# Patient Record
Sex: Female | Born: 1965
Health system: Southern US, Community
[De-identification: ages and names within clinical notes are randomized; demographics above are authoritative.]

## PROBLEM LIST (undated history)

## (undated) DIAGNOSIS — T7840XA Allergy, unspecified, initial encounter: Secondary | ICD-10-CM

## (undated) DIAGNOSIS — I10 Essential (primary) hypertension: Secondary | ICD-10-CM

## (undated) DIAGNOSIS — R7303 Prediabetes: Secondary | ICD-10-CM

## (undated) HISTORY — DX: Prediabetes: R73.03

## (undated) HISTORY — DX: Allergy, unspecified, initial encounter: T78.40XA

## (undated) HISTORY — DX: Essential (primary) hypertension: I10

---

## 2003-06-18 HISTORY — PX: ABDOMINAL HYSTERECTOMY: SHX81

## 2013-07-12 ENCOUNTER — Encounter (HOSPITAL_COMMUNITY): Payer: Self-pay | Admitting: Emergency Medicine

## 2013-07-12 ENCOUNTER — Inpatient Hospital Stay (HOSPITAL_COMMUNITY)
Admission: EM | Admit: 2013-07-12 | Discharge: 2013-07-15 | DRG: 310 | Disposition: A | Discharge status: Home / Self Care | Payer: 59 | Attending: Internal Medicine | Admitting: Internal Medicine

## 2013-07-12 DIAGNOSIS — I509 Heart failure, unspecified: Secondary | ICD-10-CM | POA: Diagnosis present

## 2013-07-12 DIAGNOSIS — R42 Dizziness and giddiness: Secondary | ICD-10-CM

## 2013-07-12 DIAGNOSIS — I4891 Unspecified atrial fibrillation: Principal | ICD-10-CM | POA: Diagnosis present

## 2013-07-12 DIAGNOSIS — R9431 Abnormal electrocardiogram [ECG] [EKG]: Secondary | ICD-10-CM

## 2013-07-12 DIAGNOSIS — R079 Chest pain, unspecified: Secondary | ICD-10-CM

## 2013-07-12 DIAGNOSIS — I498 Other specified cardiac arrhythmias: Secondary | ICD-10-CM | POA: Diagnosis present

## 2013-07-12 DIAGNOSIS — Z8249 Family history of ischemic heart disease and other diseases of the circulatory system: Secondary | ICD-10-CM

## 2013-07-12 LAB — BASIC METABOLIC PANEL
BUN: 10 mg/dL (ref 6–23)
CO2: 21 mEq/L (ref 19–32)
Calcium: 8.4 mg/dL (ref 8.4–10.5)
Chloride: 105 mEq/L (ref 96–112)
Creatinine, Ser: 0.59 mg/dL (ref 0.50–1.10)
Glucose, Bld: 136 mg/dL — ABNORMAL HIGH (ref 70–99)
POTASSIUM: 3.1 meq/L — AB (ref 3.7–5.3)
SODIUM: 142 meq/L (ref 137–147)

## 2013-07-12 LAB — CBC
HCT: 38.6 % (ref 36.0–46.0)
Hemoglobin: 12.7 g/dL (ref 12.0–15.0)
MCH: 29.1 pg (ref 26.0–34.0)
MCHC: 32.9 g/dL (ref 30.0–36.0)
MCV: 88.3 fL (ref 78.0–100.0)
Platelets: 184 10*3/uL (ref 150–400)
RBC: 4.37 MIL/uL (ref 3.87–5.11)
RDW: 12.9 % (ref 11.5–15.5)
WBC: 9.3 10*3/uL (ref 4.0–10.5)

## 2013-07-12 MED ORDER — MECLIZINE HCL 25 MG PO TABS
50.0000 mg | ORAL_TABLET | Freq: Once | ORAL | Status: AC
  Administered 2013-07-12: 50 mg via ORAL
  Filled 2013-07-12: qty 2

## 2013-07-12 MED ORDER — SODIUM CHLORIDE 0.9 % IV BOLUS (SEPSIS)
1000.0000 mL | Freq: Once | INTRAVENOUS | Status: AC
  Administered 2013-07-12: 1000 mL via INTRAVENOUS

## 2013-07-12 MED ORDER — ONDANSETRON HCL 4 MG/2ML IJ SOLN
4.0000 mg | Freq: Once | INTRAMUSCULAR | Status: AC
  Administered 2013-07-12: 4 mg via INTRAVENOUS
  Filled 2013-07-12: qty 2

## 2013-07-12 MED ORDER — POTASSIUM CHLORIDE CRYS ER 20 MEQ PO TBCR
40.0000 meq | EXTENDED_RELEASE_TABLET | Freq: Once | ORAL | Status: AC
  Administered 2013-07-12: 40 meq via ORAL
  Filled 2013-07-12: qty 2

## 2013-07-12 MED ORDER — DIAZEPAM 5 MG/ML IJ SOLN
5.0000 mg | Freq: Once | INTRAMUSCULAR | Status: AC
  Administered 2013-07-12: 10 mg via INTRAVENOUS
  Filled 2013-07-12: qty 2

## 2013-07-12 NOTE — ED Notes (Signed)
Bed: IW80 Expected date: 07/12/13 Expected time: 8:50 PM Means of arrival: Ambulance Comments: n,v

## 2013-07-12 NOTE — ED Provider Notes (Signed)
TIME SEEN: 9:10 PM  CHIEF COMPLAINT: Vertigo  HPI: Patient is a 48 y.o. female with no significant past medical history presents emergency department with sudden onset vertigo and vomiting that started while she was in class. She states it is worse with opening her eyes and movement of her head. She denies any headache, numbness, weakness. No chest pain or shortness of breath. Denies any recent fever. No hearing loss, tinnitus or ear pain. She states she had similar symptoms last week that spontaneously resolved without intervention.  ROS: See HPI Constitutional: no fever  Eyes: no drainage  ENT: no runny nose   Cardiovascular:  no chest pain  Resp: no SOB  GI: no vomiting GU: no dysuria Integumentary: no rash  Allergy: no hives  Musculoskeletal: no leg swelling  Neurological: no slurred speech ROS otherwise negative  PAST MEDICAL HISTORY/PAST SURGICAL HISTORY:  History reviewed. No pertinent past medical history.  MEDICATIONS:  Prior to Admission medications   Not on File    ALLERGIES:  Allergies not on file  SOCIAL HISTORY:  History  Substance Use Topics  . Smoking status: Not on file  . Smokeless tobacco: Not on file  . Alcohol Use: Not on file    FAMILY HISTORY: No family history on file. no family history of CAD or CVA.  EXAM: BP 125/68  Pulse 76  Temp(Src) 97.8 F (36.6 C) (Oral)  Resp 18  SpO2 95% CONSTITUTIONAL: Alert and oriented and responds appropriately to questions. Well-appearing; well-nourished HEAD: Normocephalic EYES: Conjunctivae clear, PERRL ENT: normal nose; no rhinorrhea; moist mucous membranes; pharynx without lesions noted, TMs are clear bilaterally NECK: Supple, no meningismus, no LAD  CARD: RRR; S1 and S2 appreciated; no murmurs, no clicks, no rubs, no gallops RESP: Normal chest excursion without splinting or tachypnea; breath sounds clear and equal bilaterally; no wheezes, no rhonchi, no rales,  ABD/GI: Normal bowel sounds;  non-distended; soft, non-tender, no rebound, no guarding BACK:  The back appears normal and is non-tender to palpation, there is no CVA tenderness EXT: Normal ROM in all joints; non-tender to palpation; no edema; normal capillary refill; no cyanosis    SKIN: Normal color for age and race; warm NEURO: Moves all extremities equally, strength 5/5 in all 4 extremities, cranial nerves II through XII intact, sensation to light touch intact diffusely, patient has no dysmetria finger-nose testing, patient does have horizontal nystagmus that is fatigable with opening her eyes PSYCH: The patient's mood and manner are appropriate. Grooming and personal hygiene are appropriate.  MEDICAL DECISION MAKING: Patient here with likely peripheral vertigo. She has no risk factors for central vertigo. No history of head injury. No anticoagulation. Unable to perform Dix-Hallpike given patient is asymptomatic. Will check basic labs and urine. We'll give IV fluids, Zofran and Valium and reassess. I do not feel she needs head imaging at this time. Patient is status post hysterectomy  ED PROGRESS: Patient has some improvement in her symptoms after IV fluids, Zofran and Valium. She is able to ambulate with assistance. Will give a dose of meclizine and reassess. Labs are unremarkable.  12:22 AM  Pt heart rate jumped to the 150s. She is currently in atrial fibrillation. She denies a history of any arrhythmia. No history of cardiac disease. She denies having any chest pain, shortness of breath or palpitations currently. She is still normotensive. Will add on troponin. Will check chest x-ray. Will give second liter IV fluids, diltiazem bolus and drip. She has a family history of cardiac disease. No  prior history of PE or DVT. No lower extremity swelling or pain. No recent prolonged immobilization such as long flight or hospitalization, surgery, fracture, trauma. No exogenous hormone use or tobacco use. She does not have a primary care  physician or cardiologist.  12:51 AM  Pt's troponin is negative. Chest x-ray shows no infiltrate or edema.  1:04 AM  Pt's heart rate is now in the 90s-110s on diltiazem drip; she is still in atrial fibrillation. Will discuss with hospitalist for admission.  1:20 AM  Spoke with Dr. Laren Everts for admission.   EKG Interpretation    Date/Time:  Monday July 12 2013 21:04:02 EST Ventricular Rate:  91 PR Interval:  166 QRS Duration: 91 QT Interval:  376 QTC Calculation: 463 R Axis:   72 Text Interpretation:  Age not entered, assumed to be  48 years old for purpose of ECG interpretation Sinus rhythm Consider right atrial enlargement Borderline T abnormalities, diffuse leads Baseline wander in lead(s) V1 Confirmed by Quade Ramirez  DO, Brendia Dampier [phone]) on 07/12/2013 10:00:57 PM            EKG Interpretation    Date/Time:  Tuesday July 13 2013 00:10:31 EST Ventricular Rate:  156 PR Interval:  166 QRS Duration: 85 QT Interval:  302 QTC Calculation: 486 R Axis:   79 Text Interpretation:  Atrial fibrillation Ventricular premature complex Borderline repolarization abnormality Borderline ST elevation, lateral leads Borderline prolonged QT interval Baseline wander in lead(s) V1 Confirmed by Scotty Pinder  DO, Myla Mauriello ([id number]) on 07/13/2013 12:24:00 AM             CRITICAL CARE Performed by: Nyra Jabs   Total critical care time: 30 minutes  Critical care time was exclusive of separately billable procedures and treating other patients.  New onset a fib with RVR, rate > 150s, diltiazem drip started.  Critical care was necessary to treat or prevent imminent or life-threatening deterioration.  Critical care was time spent personally by me on the following activities: development of treatment plan with patient and/or surrogate as well as nursing, discussions with consultants, evaluation of patient's response to treatment, examination of patient, obtaining history from patient or surrogate,  ordering and performing treatments and interventions, ordering and review of laboratory studies, ordering and review of radiographic studies, pulse oximetry and re-evaluation of patient's condition.   [place], DO 07/13/13 0120

## 2013-07-12 NOTE — ED Notes (Signed)
Brought in from home from school with c/o sudden onset of dizziness with emesis.  Per EMS, pt reported that she was in class when she suddenly felt "hot" and dizzy and started vomiting.  Pt was diaphoretic on EMS' arrival at the scene--- pt's CBG was 131 by EMS.  Pt was given Zofran 4 mg IV and  NS 200 ml bolus en route to ED.

## 2013-07-13 ENCOUNTER — Emergency Department (HOSPITAL_COMMUNITY): Payer: 59

## 2013-07-13 ENCOUNTER — Encounter (HOSPITAL_COMMUNITY): Payer: Self-pay | Admitting: Emergency Medicine

## 2013-07-13 DIAGNOSIS — R9431 Abnormal electrocardiogram [ECG] [EKG]: Secondary | ICD-10-CM

## 2013-07-13 DIAGNOSIS — R42 Dizziness and giddiness: Secondary | ICD-10-CM

## 2013-07-13 DIAGNOSIS — I517 Cardiomegaly: Secondary | ICD-10-CM

## 2013-07-13 DIAGNOSIS — R079 Chest pain, unspecified: Secondary | ICD-10-CM

## 2013-07-13 DIAGNOSIS — I4891 Unspecified atrial fibrillation: Principal | ICD-10-CM

## 2013-07-13 LAB — BASIC METABOLIC PANEL
BUN: 6 mg/dL (ref 6–23)
CO2: 22 mEq/L (ref 19–32)
Calcium: 9.1 mg/dL (ref 8.4–10.5)
Chloride: 101 mEq/L (ref 96–112)
Creatinine, Ser: 0.49 mg/dL — ABNORMAL LOW (ref 0.50–1.10)
GFR calc Af Amer: >90 mL/min (ref >90)
Glucose, Bld: 161 mg/dL — ABNORMAL HIGH (ref 70–99)
Potassium: 4.1 mEq/L (ref 3.7–5.3)
SODIUM: 137 meq/L (ref 137–147)

## 2013-07-13 LAB — URINALYSIS, ROUTINE W REFLEX MICROSCOPIC
Bilirubin Urine: NEGATIVE
Glucose, UA: NEGATIVE mg/dL
Hgb urine dipstick: NEGATIVE
Ketones, ur: NEGATIVE mg/dL
Nitrite: NEGATIVE
PROTEIN: NEGATIVE mg/dL
SPECIFIC GRAVITY, URINE: 1.018 (ref 1.005–1.030)
UROBILINOGEN UA: 0.2 mg/dL (ref 0.0–1.0)
pH: 5.5 (ref 5.0–8.0)

## 2013-07-13 LAB — TROPONIN I
Troponin I: <0.3 ng/mL (ref <0.30)
Troponin I: <0.3 ng/mL (ref <0.30)

## 2013-07-13 LAB — URINE MICROSCOPIC-ADD ON

## 2013-07-13 LAB — TSH
TSH: 1.205 u[IU]/mL (ref 0.350–4.500)
TSH: 0.929 u[IU]/mL (ref 0.350–4.500)

## 2013-07-13 LAB — PROTIME-INR
INR: 0.99 (ref 0.00–1.49)
Prothrombin Time: 12.9 seconds (ref 11.6–15.2)

## 2013-07-13 LAB — POCT I-STAT TROPONIN I: Troponin i, poc: 0 ng/mL (ref 0.00–0.08)

## 2013-07-13 MED ORDER — SODIUM CHLORIDE 0.9 % IV BOLUS (SEPSIS)
1000.0000 mL | Freq: Once | INTRAVENOUS | Status: AC
  Administered 2013-07-13: 1000 mL via INTRAVENOUS

## 2013-07-13 MED ORDER — ONDANSETRON HCL 4 MG/2ML IJ SOLN
4.0000 mg | Freq: Once | INTRAMUSCULAR | Status: AC
  Administered 2013-07-13: 4 mg via INTRAVENOUS

## 2013-07-13 MED ORDER — SODIUM CHLORIDE 0.9 % IV SOLN
INTRAVENOUS | Status: AC
Start: 2013-07-13 — End: 2013-07-13
  Administered 2013-07-13: 01:00:00 via INTRAVENOUS

## 2013-07-13 MED ORDER — ZOLPIDEM TARTRATE 5 MG PO TABS: 5.0000 mg | ORAL_TABLET | Freq: Every evening | ORAL | Status: DC | PRN

## 2013-07-13 MED ORDER — ONDANSETRON HCL 4 MG PO TABS: 4.0000 mg | ORAL_TABLET | Freq: Four times a day (QID) | ORAL | Status: DC | PRN

## 2013-07-13 MED ORDER — ONDANSETRON HCL 4 MG/2ML IJ SOLN
4.0000 mg | Freq: Three times a day (TID) | INTRAMUSCULAR | Status: AC | PRN
Start: 2013-07-13 — End: 2013-07-13
  Filled 2013-07-13 (×2): qty 2

## 2013-07-13 MED ORDER — DILTIAZEM HCL 30 MG PO TABS
30.0000 mg | ORAL_TABLET | Freq: Four times a day (QID) | ORAL | Status: DC
  Administered 2013-07-13 – 2013-07-14 (×4): 30 mg via ORAL
  Filled 2013-07-13 (×12): qty 1

## 2013-07-13 MED ORDER — ACETAMINOPHEN 325 MG PO TABS
650.0000 mg | ORAL_TABLET | Freq: Four times a day (QID) | ORAL | Status: DC | PRN
  Administered 2013-07-13 – 2013-07-14 (×2): 650 mg via ORAL
  Filled 2013-07-13 (×2): qty 2

## 2013-07-13 MED ORDER — WARFARIN - PHARMACIST DOSING INPATIENT: Freq: Every day | Status: DC

## 2013-07-13 MED ORDER — ONDANSETRON HCL 4 MG/2ML IJ SOLN
4.0000 mg | Freq: Four times a day (QID) | INTRAMUSCULAR | Status: DC | PRN
  Administered 2013-07-13: 4 mg via INTRAVENOUS

## 2013-07-13 MED ORDER — DILTIAZEM LOAD VIA INFUSION
10.0000 mg | Freq: Once | INTRAVENOUS | Status: AC
  Administered 2013-07-13: 10 mg via INTRAVENOUS
  Filled 2013-07-13: qty 10

## 2013-07-13 MED ORDER — POTASSIUM CHLORIDE IN NACL 20-0.9 MEQ/L-% IV SOLN
INTRAVENOUS | Status: DC
  Administered 2013-07-13: 03:00:00 via INTRAVENOUS
  Filled 2013-07-13 (×3): qty 1000

## 2013-07-13 MED ORDER — ONDANSETRON HCL 4 MG PO TABS
4.0000 mg | ORAL_TABLET | Freq: Four times a day (QID) | ORAL | Status: DC
  Administered 2013-07-13 – 2013-07-14 (×4): 4 mg via ORAL
  Filled 2013-07-13 (×12): qty 1

## 2013-07-13 MED ORDER — COUMADIN BOOK
1.0000 | Freq: Once | Status: AC
  Administered 2013-07-13: 1
  Filled 2013-07-13 (×2): qty 1

## 2013-07-13 MED ORDER — HYDROCODONE-ACETAMINOPHEN 5-325 MG PO TABS: 1.0000 | ORAL_TABLET | ORAL | Status: DC | PRN

## 2013-07-13 MED ORDER — SODIUM CHLORIDE 0.9 % IJ SOLN
3.0000 mL | Freq: Two times a day (BID) | INTRAMUSCULAR | Status: DC
Start: 2013-07-13 — End: 2013-07-15
  Administered 2013-07-14 – 2013-07-15 (×4): 3 mL via INTRAVENOUS

## 2013-07-13 MED ORDER — METOPROLOL TARTRATE 25 MG PO TABS
25.0000 mg | ORAL_TABLET | Freq: Two times a day (BID) | ORAL | Status: DC
  Administered 2013-07-13 – 2013-07-15 (×6): 25 mg via ORAL
  Filled 2013-07-13 (×7): qty 1

## 2013-07-13 MED ORDER — MECLIZINE HCL 12.5 MG PO TABS
12.5000 mg | ORAL_TABLET | Freq: Three times a day (TID) | ORAL | Status: DC | PRN
  Filled 2013-07-13: qty 1

## 2013-07-13 MED ORDER — FUROSEMIDE 10 MG/ML IJ SOLN
40.0000 mg | Freq: Once | INTRAMUSCULAR | Status: AC
  Administered 2013-07-13: 40 mg via INTRAVENOUS
  Filled 2013-07-13: qty 4

## 2013-07-13 MED ORDER — ONDANSETRON HCL 4 MG/2ML IJ SOLN
4.0000 mg | Freq: Four times a day (QID) | INTRAMUSCULAR | Status: DC
Start: 2013-07-13 — End: 2013-07-15

## 2013-07-13 MED ORDER — ENOXAPARIN SODIUM 150 MG/ML ~~LOC~~ SOLN: 1.0000 mg/kg | Freq: Two times a day (BID) | SUBCUTANEOUS | Status: DC

## 2013-07-13 MED ORDER — WARFARIN VIDEO: Freq: Once | Status: DC

## 2013-07-13 MED ORDER — METOPROLOL TARTRATE 1 MG/ML IV SOLN: 2.5000 mg | Freq: Four times a day (QID) | INTRAVENOUS | Status: DC | PRN

## 2013-07-13 MED ORDER — ENOXAPARIN SODIUM 100 MG/ML ~~LOC~~ SOLN
100.0000 mg | Freq: Two times a day (BID) | SUBCUTANEOUS | Status: DC
  Administered 2013-07-13 (×3): 100 mg via SUBCUTANEOUS
  Filled 2013-07-13 (×5): qty 1

## 2013-07-13 MED ORDER — POTASSIUM CHLORIDE CRYS ER 20 MEQ PO TBCR
20.0000 meq | EXTENDED_RELEASE_TABLET | Freq: Two times a day (BID) | ORAL | Status: DC
  Administered 2013-07-13 – 2013-07-15 (×5): 20 meq via ORAL
  Filled 2013-07-13 (×6): qty 1

## 2013-07-13 MED ORDER — DILTIAZEM HCL 100 MG IV SOLR: 5.0000 mg/h | INTRAVENOUS | Status: DC

## 2013-07-13 MED ORDER — WARFARIN SODIUM 7.5 MG PO TABS
7.5000 mg | ORAL_TABLET | ORAL | Status: AC
  Administered 2013-07-13: 7.5 mg via ORAL
  Filled 2013-07-13: qty 1

## 2013-07-13 MED ORDER — ACETAMINOPHEN 650 MG RE SUPP: 650.0000 mg | Freq: Four times a day (QID) | RECTAL | Status: DC | PRN

## 2013-07-13 MED ORDER — MORPHINE SULFATE 2 MG/ML IJ SOLN
2.0000 mg | INTRAMUSCULAR | Status: DC | PRN
  Administered 2013-07-13: 2 mg via INTRAVENOUS
  Filled 2013-07-13: qty 1

## 2013-07-13 NOTE — Progress Notes (Addendum)
ANTICOAGULATION CONSULT NOTE - Initial Consult  Pharmacy Consult for warfarin Indication: atrial fibrillation  No Known Allergies  Patient Measurements: Height: 5\' 11"  (180.3 cm) Weight: 220 lb (99.791 kg) IBW/kg (Calculated) : 70.8 Heparin Dosing Weight:   Vital Signs: Temp: 97.9 F (36.6 C) (01/27 0111) Temp src: Oral (01/26 2106) BP: 119/70 mmHg (01/27 0430) Pulse Rate: 91 (01/27 0430)  Labs:  Recent Labs  07/12/13 2025 07/13/13 0250  HGB 12.7  --   HCT 38.6  --   PLT 184  --   CREATININE 0.59  --   TROPONINI  --  <0.30    Estimated Creatinine Clearance: 113.1 ml/min (by C-G formula based on Cr of 0.59).   Medical History: History reviewed. No pertinent past medical history.  Medications:  Scheduled:  . coumadin book  1 each Does not apply Once  . enoxaparin (LOVENOX) injection  100 mg Subcutaneous BID  . metoprolol tartrate  25 mg Oral BID  . sodium chloride  3 mL Intravenous Q12H  . [START ON 07/14/2013] warfarin   Does not apply Once  . Warfarin - Pharmacist Dosing Inpatient   Does not apply q1800    Assessment: Patient with new afib.  No prior anticoagulation noted.  Also on lovenox bridging.  Goal of Therapy:  INR 2-3    Plan:  Daily INR Warfarin 7.5mg  PO x1 at 0300 Coumadin book/video     Tyler Deis, Shea Stakes Crowford 07/13/2013,5:28 AM

## 2013-07-13 NOTE — Progress Notes (Signed)
[facility] PROGRESS NOTE  Teresa Little STM:[id number] DOB: 06-02-1966 DOA: 07/12/2013 PCP: Default, Provider, MD  Assessment/Plan: atrial fibrillation with a ventricular rate  New onset atrial fib  No risk factors  Place on Cardizem drip  By mouth Lopressor  When necessary IV Lopressor  Check echo  Check TSH  Consulted Cardiology for treatment recommendations since patient back in NSR    Vertigo  probably related to #1  IV fluids  When necessary Antivert   Congestive heart failure (by chest x-ray ) Give Lasix x1 dose    Code Status: Full  Family Communication: None Disposition Plan: Per cardio   Consultants: Dr. Golden Hurter (cardiology)    Procedures: [facility] 07/13/2013 Prominent vascular markings concerning for pulmonary edema.    Antibiotics:    HPI/Subjective: Teresa Little is a 48 y.o.BF PMHx none Presented today with dizziness and vomiting that started just after she finished her PM class. Patient denies any chest pains or palpitations. She denies fever or chills . Patient has been having similar episodes for the last few months and the last one was last week. No history of Coronary artery disease or atrial fibrillation. In the emergency room her rhythm suddenly went into atrial fib with rapid ventricular rate and she was started on Cardizem drip and I was called to admit. 1/27 although back in NSR still complaining of positive N./V., negative CP, negative SOB   Objective: Filed Vitals:   07/13/13 0600 07/13/13 0800 07/13/13 0900 07/13/13 0915  BP: 111/78 111/73 110/79 116/72  Pulse: 140 94 87 93  Temp:    98.1 F (36.7 C)  TempSrc:    Oral  Resp: 18 14 15 19   Height:      Weight:      SpO2: 96% 97% 97% 97%    Intake/Output Summary (Last 24 hours) at 07/13/13 0926 Last data filed at 07/13/13 0113  Gross per 24 hour  Intake   2000 ml  Output      0 ml  Net   2000 ml   Filed Weights   07/13/13 0222  Weight: 99.791 kg (220 lb)     Exam:   General: A/O X4, (+) N recent vomited  Cardiovascular: RRR, (-) M/R/G  Respiratory: CTA Bilat  Abdomen: Soft, NT, ND, (+) BS  Musculoskeletal: (-) Pedal Edema   Data Reviewed: Basic Metabolic Panel:  Recent Labs Lab 07/12/13 2025 07/13/13 0525  NA 142 137  K 3.1* 4.1  CL 105 101  CO2 21 22  GLUCOSE 136* 161*  BUN 10 6  CREATININE 0.59 0.49*  CALCIUM 8.4 9.1   Liver Function Tests: No results found for this basename: AST, ALT, ALKPHOS, BILITOT, PROT, ALBUMIN,  in the last 168 hours No results found for this basename: LIPASE, AMYLASE,  in the last 168 hours No results found for this basename: AMMONIA,  in the last 168 hours CBC:  Recent Labs Lab 07/12/13 2025  WBC 9.3  HGB 12.7  HCT 38.6  MCV 88.3  PLT 184   Cardiac Enzymes:  Recent Labs Lab 07/13/13 0250  TROPONINI <0.30   BNP (last 3 results) No results found for this basename: PROBNP,  in the last 8760 hours CBG: No results found for this basename: GLUCAP,  in the last 168 hours  No results found for this or any previous visit (from the past 240 hour(s)).   Studies: Dg Chest Port 1 View  07/13/2013   CLINICAL DATA:  Atrial fibrillation.  EXAM: PORTABLE CHEST -  1 VIEW  COMPARISON:  None available for comparison at time of study interpretation.  FINDINGS: Cardiomediastinal silhouette is unremarkable for this low inspiratory portable examination with crowded engorged vasculature markings. The lungs are clear without pleural effusions or focal consolidations. Trachea projects midline and there is no pneumothorax. Included soft tissue planes and osseous structures are non-suspicious. Multiple EKG lines overlie the patient and may obscure subtle underlying pathology.  IMPRESSION: Prominent vascular markings concerning for pulmonary edema.   Electronically Signed   By: Elon Alas   On: 07/13/2013 01:23    Scheduled Meds: . coumadin book  1 each Does not apply Once  . enoxaparin (LOVENOX)  injection  100 mg Subcutaneous BID  . metoprolol tartrate  25 mg Oral BID  . potassium chloride  20 mEq Oral BID  . sodium chloride  3 mL Intravenous Q12H  . [START ON 07/14/2013] warfarin   Does not apply Once  . Warfarin - Pharmacist Dosing Inpatient   Does not apply q1800   Continuous Infusions: . diltiazem (CARDIZEM) infusion      Principal Problem:   Atrial fibrillation with RVR Active Problems:   Vertigo   Atrial fibrillation, rapid    Time spent: 40 minutes   WOODS, CURTIS, J  [facility] Pager [phone]. If 7PM-7AM, please contact night-coverage at [email or web address], password [facility] 07/13/2013, 9:26 AM  LOS: 1 day

## 2013-07-13 NOTE — H&P (Addendum)
[facility]                                                                                    Patient Demographics  Teresa Little, is a 48 y.o. female  CSN: [id number]  MRN: [id number]  DOB - April 17, 1966  Admit Date - 07/12/2013  Outpatient Primary MD for the patient is Default, Provider, MD   With History of -  History reviewed. No pertinent past medical history.    History reviewed. No pertinent past surgical history.  in for   Chief Complaint  Patient presents with  . Dizziness  . Emesis     HPI  Teresa Little  is a 48 y.o. female, with no significant past medical history who presented today with dizziness and vomiting that started just after she finished her PM class. Patient denies any chest pains or palpitations. She denies fever or chills . Patient has been having similar episodes for the last few months and the last one was last week. No history of Coronary artery disease or atrial fibrillation. In the emergency room her rhythm suddenly went into atrial fib with rapid ventricular rate and she was started on Cardizem drip and I was called to admit.    Review of Systems    In addition to the HPI above,  No Fever-chills, No Headache, No changes with Vision or hearing, No problems swallowing food or Liquids, No Chest pain, Cough or Shortness of Breath, No Abdominal pain,  Bowel movements are regular, No Blood in stool or Urine, No dysuria, No new skin rashes or bruises, No new joints pains-aches,  No new weakness, tingling, numbness in any extremity, No recent weight gain or loss, No polyuria, polydypsia or polyphagia, No significant Mental Stressors.  A full 10 point Review of Systems was done, except as stated above, all other Review of Systems were negative.   Social History History  Substance Use Topics  . Smoking status: Never Smoker   . Smokeless tobacco: Never Used  . Alcohol Use: Yes     Family History Mother had hypertension father  had coronary artery disease status post CABG  Prior to Admission medications   Not on File    No Known Allergies  Physical Exam  Vitals  Blood pressure 113/61, pulse 89, temperature 97.9 F (36.6 C), temperature source Oral, resp. rate 18, height 5\' 11"  (1.803 m), weight 99.791 kg (220 lb), SpO2 95.00%.   1. General: Young [place] American female very pleasant looks tired  2. Normal affect and insight, Not Suicidal or Homicidal, Awake Alert, Oriented X 3.  3. No F.N deficits, ALL C.Nerves Intact, Strength 5/5 all 4 extremities, Sensation intact all 4 extremities, Plantars down going.  4. Ears and Eyes appear Normal, Conjunctivae clear, PERRLA. Moist Oral Mucosa.  5. Supple Neck, No JVD, No cervical lymphadenopathy appriciated, No Carotid Bruits.  6. Symmetrical Chest wall movement, Good air movement bilaterally, CTAB.  7. irregular, No Gallops, Rubs or Murmurs, No Parasternal Heave.  8. Positive Bowel Sounds, Abdomen Soft, Non tender, No organomegaly appriciated,No rebound -guarding or rigidity.  9.  No Cyanosis, Normal Skin Turgor, No Skin Rash or Bruise.  10.  Good muscle tone,  joints appear normal , no effusions, Normal ROM.  11. No Palpable Lymph Nodes in Neck or Axillae    Data Review  CBC  Recent Labs Lab 07/12/13 2025  WBC 9.3  HGB 12.7  HCT 38.6  PLT 184  MCV 88.3  MCH 29.1  MCHC 32.9  RDW 12.9   ------------------------------------------------------------------------------------------------------------------  Chemistries   Recent Labs Lab 07/12/13 2025  NA 142  K 3.1*  CL 105  CO2 21  GLUCOSE 136*  BUN 10  CREATININE 0.59  CALCIUM 8.4   ------------------------------------------------------------------------------------------------------------------ estimated creatinine clearance is 113.1 ml/min (by C-G formula based on Cr of 0.59). Urinalysis    Component Value Date/Time   COLORURINE YELLOW 07/12/2013 2321   APPEARANCEUR CLEAR  07/12/2013 2321   LABSPEC 1.018 07/12/2013 2321   PHURINE 5.5 07/12/2013 2321   GLUCOSEU NEGATIVE 07/12/2013 2321   HGBUR NEGATIVE 07/12/2013 2321   BILIRUBINUR NEGATIVE 07/12/2013 2321   KETONESUR NEGATIVE 07/12/2013 2321   PROTEINUR NEGATIVE 07/12/2013 2321   UROBILINOGEN 0.2 07/12/2013 2321   NITRITE NEGATIVE 07/12/2013 2321   LEUKOCYTESUR SMALL* 07/12/2013 2321    ----------------------------------------------------------------------------------------------------------------   Imaging results:   Dg Chest Port 1 View  07/13/2013   CLINICAL DATA:  Atrial fibrillation.  EXAM: PORTABLE CHEST - 1 VIEW  COMPARISON:  None available for comparison at time of study interpretation.  FINDINGS: Cardiomediastinal silhouette is unremarkable for this low inspiratory portable examination with crowded engorged vasculature markings. The lungs are clear without pleural effusions or focal consolidations. Trachea projects midline and there is no pneumothorax. Included soft tissue planes and osseous structures are non-suspicious. Multiple EKG lines overlie the patient and may obscure subtle underlying pathology.  IMPRESSION: Prominent vascular markings concerning for pulmonary edema.   Electronically Signed   By: Elon Alas   On: 07/13/2013 01:23    My personal review of EKG:    Assessment & Plan  1. atrial fibrillation with a ventricular rate    New onset atrial fib    No risk factors    Place on Cardizem drip    By mouth Lopressor    When necessary IV Lopressor    Check echo    Check TSH    Consider cardiology consult in a.m. for possible cardioversion  2. Vertigo probably related to #1    IV fluids    When necessary Antivert  3. Congestive heart failure by chest x-ray     Give Lasix x1 dose      DVT Prophylaxis Lovenox  AM Labs Ordered, also please review Full Orders  Family Communication: Admission, patients condition and plan of care including tests being ordered have been discussed  with the patient who indicates understanding and agrees with the plan and Code Status.  Code Status full  Disposition Plan: Home  Time spent in minutes : 34 minutes   Condition GUARDED

## 2013-07-13 NOTE — Consult Note (Signed)
Admit date: 07/12/2013 Referring Physician  Dr. Sherral Hammers Primary Cardiologist  NONE Reason for Consultation  Afib with RVR  HPI: Teresa Little is a 48 y.o. female, with no significant past medical history who presented today with dizziness and vomiting that started just after she finished her PM class. Patient denies any chest pains or palpitations. She denies fever or chills . Patient has been having similar episodes for the last few months and the last one was last week. No history of Coronary artery disease or atrial fibrillation. In the emergency room her rhythm suddenly went into atrial fib with rapid ventricular rate and she was started on Cardizem drip.  Cardiology is now asked to consult. She converted to NSR after IV Cardizem.  She denies any exertional chest pain or SOB.  She is not a smoker and only occasionally drinks alcohol.  She has no history of palpitations or afib.  She has a strong family history of CAD at an early age.  Her sister died of an MI at age 79 and was not diabetic or a smoker.  Her dad has CAD and has had a CABG.  She had an episode of chest discomfort last PM that was a pressure sensation but that is the first chest discomfort she has had.      PMH:  History reviewed. No pertinent past medical history.   PSH:   Past Surgical History  Procedure Laterality Date  . Abdominal hysterectomy  2005    Allergies:  Review of patient's allergies indicates no known allergies. Prior to Admit Meds:   (Not in a hospital admission) Fam HX:    Family History  Problem Relation Age of Onset  . Hypertension Mother   . Heart disease Father   . Coronary artery disease Father   . Heart attack Sister   . Heart disease Sister    Social HX:    History   Social History  . Marital Status: Legally Separated    Spouse Name: N/A    Number of Children: N/A  . Years of Education: N/A   Occupational History  . Not on file.   Social History Main Topics  . Smoking status: Never Smoker    . Smokeless tobacco: Never Used  . Alcohol Use: Yes     Comment: social  . Drug Use: No  . Sexual Activity: No   Other Topics Concern  . Not on file   Social History Narrative  . No narrative on file     ROS:  All 11 ROS were addressed and are negative except what is stated in the HPI  Physical Exam: Blood pressure 125/76, pulse 102, temperature 98.1 F (36.7 C), temperature source Oral, resp. rate 19, height 5\' 11"  (1.803 m), weight 220 lb (99.791 kg), SpO2 96.00%.    General: Well developed, well nourished, in no acute distress Head: Eyes PERRLA, No xanthomas.   Normal cephalic and atramatic  Lungs:   Clear bilaterally to auscultation and percussion. Heart:   HRRR S1 S2 Pulses are 2+ & equal.            No carotid bruit. No JVD.  No abdominal bruits. No femoral bruits. Abdomen: Bowel sounds are positive, abdomen soft and non-tender without masses  Extremities:   No clubbing, cyanosis or edema.  DP +1 Neuro: Alert and oriented X 3. Psych:  Good affect, responds appropriately    Labs:   Lab Results  Component Value Date   WBC 9.3 07/12/2013  HGB 12.7 07/12/2013   HCT 38.6 07/12/2013   MCV 88.3 07/12/2013   PLT 184 07/12/2013     Recent Labs Lab 07/13/13 0525  NA 137  K 4.1  CL 101  CO2 22  BUN 6  CREATININE 0.49*  CALCIUM 9.1  GLUCOSE 161*   No results found for this basename: PTT   Lab Results  Component Value Date   INR 0.99 07/13/2013   Lab Results  Component Value Date   TROPONINI <0.30 07/13/2013     No results found for this basename: CHOL   No results found for this basename: HDL   No results found for this basename: LDLCALC   No results found for this basename: TRIG   No results found for this basename: CHOLHDL   No results found for this basename: LDLDIRECT      Radiology:  Dg Chest Port 1 View  07/13/2013   CLINICAL DATA:  Atrial fibrillation.  EXAM: PORTABLE CHEST - 1 VIEW  COMPARISON:  None available for comparison at time of  study interpretation.  FINDINGS: Cardiomediastinal silhouette is unremarkable for this low inspiratory portable examination with crowded engorged vasculature markings. The lungs are clear without pleural effusions or focal consolidations. Trachea projects midline and there is no pneumothorax. Included soft tissue planes and osseous structures are non-suspicious. Multiple EKG lines overlie the patient and may obscure subtle underlying pathology.  IMPRESSION: Prominent vascular markings concerning for pulmonary edema.   Electronically Signed   By: Elon Alas   On: 07/13/2013 01:23    EKG:  #1 NSR with T wave inversions in the inferolateral leads.              #2 atrial fibrillation at 156bpm            #3 atrial fibrillation at 79bpm with diffuse ST/T wave abnormality   ASSESSMENT:  1.  Paroxysmal atrial fibrillation with RVR now converted to NSR on IV Cardizem gtt.  Initially in NSR on admit.2 2.  Paroxysmal vertigo with nausea and vomiting 3.  Abnormal EKG with baseline ST/T wave changes in a patient with a very strong family history of ASCAD with her sister have a fatal MI at age 34 and her dad with CAD and CABG.  She has not had any CP until last evening when she had some chest pressure.   PLAN:   1.  Change IV Cardizem to PO Cardizem 30mg  q6 hours 2.  Cycle cardiac enzymes 3.  2D echo to evaluate LVF - if LVF is normal then proceed with nuclear stress test but if LVF is abnormal then proceed with cardiac cath given abnormal EKG and family history  4.  Check TSH 5.  No anticoagulation needed at this time since she was in NSR on arrival and is now back in NSR 6.  Workup of vertigo by [place], MD  07/13/2013  11:58 AM

## 2013-07-13 NOTE — ED Notes (Signed)
Patient c/o nausea.  Not able to give Zofran because too close to prior dose.  MD notified.

## 2013-07-13 NOTE — Care Management Note (Addendum)
    Page 1 of 1   07/15/2013     2:25:49 PM   CARE MANAGEMENT NOTE 07/15/2013  Patient:  Teresa Little,Teresa Little   Account Number:  [id number]  Date Initiated:  07/13/2013  Documentation initiated by:  Dessa Phi  Subjective/Objective Assessment:   48 Y/O F ADMITTED W/DIZZINESS,EMESIS.AFIB W/RVR.FAMILY HX:CAD,CABG.     Action/Plan:   FROM HOME.   Anticipated DC Date:  07/15/2013   Anticipated DC Plan:  [place]  CM consult      Choice offered to / List presented to:             Status of service:  Completed, signed off Medicare Important Message given?   (If response is "NO", the following Medicare IM given date fields will be blank) Date Medicare IM given:   Date Additional Medicare IM given:    Discharge Disposition:  HOME/SELF CARE  Per UR Regulation:  Reviewed for med. necessity/level of care/duration of stay  If discussed at [place] of Stay Meetings, dates discussed:    Comments:  07/15/13 Andoni Busch RN,BSN NCM [phone] D/C HOME NO [place].  07/14/13 Bassheva Flury RN,BSN NCM [phone] FOR STRESS TEST @ [facility].  07/13/13 Halle Davlin RN,BSN NCM [place] [facility] D/C,NOW IN NSR.PO CARDIZEM,CYCLE CE.

## 2013-07-13 NOTE — Progress Notes (Signed)
Echocardiogram 2D Echocardiogram has been performed.  Manases Etchison 07/13/2013, 1:49 PM

## 2013-07-14 ENCOUNTER — Inpatient Hospital Stay (HOSPITAL_COMMUNITY)
Admit: 2013-07-14 | Discharge: 2013-07-14 | Disposition: A | Discharge status: Home / Self Care | Payer: 59 | Attending: Cardiology | Admitting: Cardiology

## 2013-07-14 ENCOUNTER — Ambulatory Visit (HOSPITAL_COMMUNITY)
Admit: 2013-07-14 | Discharge: 2013-07-14 | Disposition: A | Discharge status: Home / Self Care | Payer: 59 | Attending: Cardiology | Admitting: Cardiology

## 2013-07-14 ENCOUNTER — Other Ambulatory Visit: Payer: Self-pay

## 2013-07-14 DIAGNOSIS — R079 Chest pain, unspecified: Secondary | ICD-10-CM

## 2013-07-14 LAB — COMPREHENSIVE METABOLIC PANEL
ALK PHOS: 107 U/L (ref 39–117)
ALT: 17 U/L (ref 0–35)
AST: 19 U/L (ref 0–37)
Albumin: 4.3 g/dL (ref 3.5–5.2)
BUN: 9 mg/dL (ref 6–23)
CALCIUM: 9.8 mg/dL (ref 8.4–10.5)
CO2: 24 meq/L (ref 19–32)
Chloride: 102 mEq/L (ref 96–112)
Creatinine, Ser: 0.8 mg/dL (ref 0.50–1.10)
GFR, EST NON AFRICAN AMERICAN: 86 mL/min — AB (ref >90)
GLUCOSE: 81 mg/dL (ref 70–99)
Potassium: 3.9 mEq/L (ref 3.7–5.3)
Sodium: 142 mEq/L (ref 137–147)
Total Bilirubin: 0.5 mg/dL (ref 0.3–1.2)
Total Protein: 8 g/dL (ref 6.0–8.3)

## 2013-07-14 LAB — CBC WITH DIFFERENTIAL/PLATELET
BASOS PCT: 1 % (ref 0–1)
Basophils Absolute: 0 10*3/uL (ref 0.0–0.1)
Eosinophils Absolute: 0.1 10*3/uL (ref 0.0–0.7)
Eosinophils Relative: 1 % (ref 0–5)
HCT: 43.7 % (ref 36.0–46.0)
Hemoglobin: 14.5 g/dL (ref 12.0–15.0)
Lymphocytes Relative: 42 % (ref 12–46)
Lymphs Abs: 2.8 10*3/uL (ref 0.7–4.0)
MCH: 29.1 pg (ref 26.0–34.0)
MCHC: 33.2 g/dL (ref 30.0–36.0)
MCV: 87.8 fL (ref 78.0–100.0)
Monocytes Absolute: 0.4 10*3/uL (ref 0.1–1.0)
Monocytes Relative: 6 % (ref 3–12)
NEUTROS ABS: 3.4 10*3/uL (ref 1.7–7.7)
Neutrophils Relative %: 50 % (ref 43–77)
PLATELETS: 233 10*3/uL (ref 150–400)
RBC: 4.98 MIL/uL (ref 3.87–5.11)
RDW: 12.9 % (ref 11.5–15.5)
WBC: 6.7 10*3/uL (ref 4.0–10.5)

## 2013-07-14 LAB — PROTIME-INR
INR: 1.01 (ref 0.00–1.49)
Prothrombin Time: 13.1 seconds (ref 11.6–15.2)

## 2013-07-14 LAB — MAGNESIUM: Magnesium: 2 mg/dL (ref 1.5–2.5)

## 2013-07-14 LAB — TROPONIN I

## 2013-07-14 MED ORDER — TECHNETIUM TC 99M SESTAMIBI GENERIC - CARDIOLITE
30.0000 | Freq: Once | INTRAVENOUS | Status: AC | PRN
  Administered 2013-07-14: 30 via INTRAVENOUS

## 2013-07-14 MED ORDER — REGADENOSON 0.4 MG/5ML IV SOLN
0.4000 mg | Freq: Once | INTRAVENOUS | Status: AC
  Administered 2013-07-14: 0.4 mg via INTRAVENOUS

## 2013-07-14 MED ORDER — TECHNETIUM TC 99M SESTAMIBI GENERIC - CARDIOLITE
10.0000 | Freq: Once | INTRAVENOUS | Status: AC | PRN
  Administered 2013-07-14: 10 via INTRAVENOUS

## 2013-07-14 MED ORDER — DILTIAZEM HCL ER COATED BEADS 120 MG PO CP24
120.0000 mg | ORAL_CAPSULE | Freq: Every day | ORAL | Status: DC
  Administered 2013-07-14 – 2013-07-15 (×2): 120 mg via ORAL
  Filled 2013-07-14 (×2): qty 1

## 2013-07-14 MED ORDER — ENOXAPARIN SODIUM 60 MG/0.6ML ~~LOC~~ SOLN
50.0000 mg | SUBCUTANEOUS | Status: DC
  Administered 2013-07-14: 50 mg via SUBCUTANEOUS
  Filled 2013-07-14 (×2): qty 0.6

## 2013-07-14 MED ORDER — REGADENOSON 0.4 MG/5ML IV SOLN
INTRAVENOUS | Status: AC
  Filled 2013-07-14: qty 5

## 2013-07-14 NOTE — Progress Notes (Addendum)
[facility] PROGRESS NOTE  Teresa Little [id number] DOB: [birth year] DOA: 07/12/2013 PCP: Sueanne Margarita, MD  Assessment/Plan: atrial fibrillation with a ventricular rate  -New onset atrial fib  -No risk factors  -DCed Cardizem drip  -PO Lopressor 25 mg BID -Echocardiogram; see results below  - TSH; within normal limits  -Consulted Cardiology; performed nuclear medicine stress test, results pending     Vertigo  -probably related to #1; resolved  -IV fluids  -Continue Antivert PRN  Congestive heart failure (by chest x-ray ) -Not supported by echocardiogram -Nuclear medicine stress test pending results    Code Status: Full  Family Communication: None Disposition Plan: Per cardio   Consultants: Dr. Golden Hurter (cardiology)    Procedures: Echocardiogram 07/13/2013 - Left ventricle: The cavity size was normal. Systolic function was vigorous.  -LVEF=65% to 70%.  - Left ventricular diastolic function parameters were normal. - Aortic valve: Trileaflet; normal thickness leaflets. No regurgitation. - Aortic root: The aortic root was normal in size. - Mitral valve: No regurgitation. - Left atrium: The atrium was mildly dilated. - Right ventricle: The cavity size was normal.  - Pulmonary arteries: Systolic pressure was within the normal range. - Inferior vena cava: The vessel was normal in size. - Pericardium, extracardiac: There was no pericardial effusion.    [facility] 07/13/2013 Prominent vascular markings concerning for pulmonary edema.    Antibiotics:    HPI/Subjective: Teresa Little is a 48 y.o.BF PMHx none Presented today with dizziness and vomiting that started just after she finished her PM class. Patient denies any chest pains or palpitations. She denies fever or chills . Patient has been having similar episodes for the last few months and the last one was last week. No history of Coronary artery disease or atrial fibrillation. In the emergency room her rhythm  suddenly went into atrial fib with rapid ventricular rate and she was started on Cardizem drip and I was called to admit. 1/27 although back in NSR still complaining of positive N./V., negative CP, negative SOB 1/28 patient sitting in bed comfortably negative CP, negative SOB, negative palpitations S/P nuclear medicine stress test awaiting results/recommendations from cardiology   Objective: Filed Vitals:   07/13/13 2136 07/14/13 0127 07/14/13 0130 07/14/13 0641  BP:  127/82  109/70  Pulse: 72 61 68 66  Temp:    98.4 F (36.9 C)  TempSrc:    Oral  Resp:    16  Height:      Weight:      SpO2:    99%    Intake/Output Summary (Last 24 hours) at 07/14/13 0858 Last data filed at 07/13/13 2156  Gross per 24 hour  Intake    360 ml  Output      0 ml  Net    360 ml   Filed Weights   07/13/13 0222 07/13/13 1210  Weight: 99.791 kg (220 lb) 102.059 kg (225 lb)    Exam:   General: A/O X4, (+) N recent vomited  Cardiovascular: RRR, (-) M/R/G  Respiratory: CTA Bilat  Abdomen: Soft, NT, ND, (+) BS  Musculoskeletal: (-) Pedal Edema   Data Reviewed: Basic Metabolic Panel:  Recent Labs Lab 07/12/13 2025 07/13/13 0525  NA 142 137  K 3.1* 4.1  CL 105 101  CO2 21 22  GLUCOSE 136* 161*  BUN 10 6  CREATININE 0.59 0.49*  CALCIUM 8.4 9.1   Liver Function Tests: No results found for this basename: AST, ALT, ALKPHOS, BILITOT, PROT, ALBUMIN,  in the last  168 hours No results found for this basename: LIPASE, AMYLASE,  in the last 168 hours No results found for this basename: AMMONIA,  in the last 168 hours CBC:  Recent Labs Lab 07/12/13 2025  WBC 9.3  HGB 12.7  HCT 38.6  MCV 88.3  PLT 184   Cardiac Enzymes:  Recent Labs Lab 07/13/13 0250 07/13/13 0853 07/13/13 1440 07/13/13 2340  TROPONINI <0.30 <0.30 <0.30 <0.30   BNP (last 3 results) No results found for this basename: PROBNP,  in the last 8760 hours CBG: No results found for this basename: GLUCAP,  in the  last 168 hours  No results found for this or any previous visit (from the past 240 hour(s)).   Studies: Dg Chest Port 1 View  07/13/2013   CLINICAL DATA:  Atrial fibrillation.  EXAM: PORTABLE CHEST - 1 VIEW  COMPARISON:  None available for comparison at time of study interpretation.  FINDINGS: Cardiomediastinal silhouette is unremarkable for this low inspiratory portable examination with crowded engorged vasculature markings. The lungs are clear without pleural effusions or focal consolidations. Trachea projects midline and there is no pneumothorax. Included soft tissue planes and osseous structures are non-suspicious. Multiple EKG lines overlie the patient and may obscure subtle underlying pathology.  IMPRESSION: Prominent vascular markings concerning for pulmonary edema.   Electronically Signed   By: Elon Alas   On: 07/13/2013 01:23    Scheduled Meds: . diltiazem  30 mg Oral Q6H  . enoxaparin (LOVENOX) injection  100 mg Subcutaneous BID  . metoprolol tartrate  25 mg Oral BID  . ondansetron  4 mg Oral Q6H   Or  . ondansetron (ZOFRAN) IV  4 mg Intravenous Q6H  . potassium chloride  20 mEq Oral BID  . sodium chloride  3 mL Intravenous Q12H  . warfarin   Does not apply Once  . Warfarin - Pharmacist Dosing Inpatient   Does not apply q1800   Continuous Infusions:    Principal Problem:   Atrial fibrillation with RVR Active Problems:   Vertigo   Atrial fibrillation, rapid   Chest pain   Abnormal EKG    Time spent: 40 minutes   WOODS, Pillager, J  [facility] Pager [phone]. If 7PM-7AM, please contact night-coverage at [email or web address], password [facility] 07/14/2013, 8:58 AM  LOS: 2 days

## 2013-07-14 NOTE — Progress Notes (Signed)
Patient Name: Angelic Schnelle Date of Encounter: 07/14/2013  Principal Problem:   Atrial fibrillation with RVR Active Problems:   Vertigo   Atrial fibrillation, rapid   Chest pain   Abnormal EKG   Length of Stay: 2  SUBJECTIVE  Patient in [facility] for stress test.  CURRENT MEDS . diltiazem  30 mg Oral Q6H  . enoxaparin (LOVENOX) injection  100 mg Subcutaneous BID  . metoprolol tartrate  25 mg Oral BID  . ondansetron  4 mg Oral Q6H   Or  . ondansetron (ZOFRAN) IV  4 mg Intravenous Q6H  . potassium chloride  20 mEq Oral BID  . sodium chloride  3 mL Intravenous Q12H  . warfarin   Does not apply Once  . Warfarin - Pharmacist Dosing Inpatient   Does not apply q1800    OBJECTIVE  Filed Vitals:   07/13/13 2136 07/14/13 0127 07/14/13 0130 07/14/13 0641  BP:  127/82  109/70  Pulse: 72 61 68 66  Temp:    98.4 F (36.9 C)  TempSrc:    Oral  Resp:    16  Height:      Weight:      SpO2:    99%    Intake/Output Summary (Last 24 hours) at 07/14/13 0921 Last data filed at 07/13/13 2156  Gross per 24 hour  Intake    360 ml  Output      0 ml  Net    360 ml   Filed Weights   07/13/13 0222 07/13/13 1210  Weight: 220 lb (99.791 kg) 225 lb (102.059 kg)    PHYSICAL EXAM  General: Pleasant, NAD. Neuro: Alert and oriented X 3. Moves all extremities spontaneously. Psych: Normal affect. HEENT:  Normal  Neck: Supple without bruits or JVD. Lungs:  Resp regular and unlabored, CTA. Heart: RRR no s3, s4, or murmurs. Abdomen: Soft, non-tender, non-distended, BS + x 4.  Extremities: No clubbing, cyanosis or edema. DP/PT/Radials 2+ and equal bilaterally.  Accessory Clinical Findings  CBC  Recent Labs  07/12/13 2025  WBC 9.3  HGB 12.7  HCT 38.6  MCV 88.3  PLT 702   Basic Metabolic Panel  Recent Labs  07/12/13 2025 07/13/13 0525  NA 142 137  K 3.1* 4.1  CL 105 101  CO2 21 22  GLUCOSE 136* 161*  BUN 10 6  CREATININE 0.59 0.49*  CALCIUM 8.4 9.1   Cardiac  Enzymes  Recent Labs  07/13/13 0853 07/13/13 1440 07/13/13 2340  TROPONINI <0.30 <0.30 <0.30    Recent Labs  07/13/13 1440  TSH 0.929    Radiology/Studies  Dg Chest Port 1 View  07/13/2013   CLINICAL DATA:  Atrial fibrillation.    IMPRESSION: Prominent vascular markings concerning for pulmonary edema.     TELE: SR  Echo 07/13/2013 Study Conclusions  - Left ventricle: The cavity size was normal. Systolic function was vigorous. The estimated ejection fraction was in the range of 65% to 70%. Wall motion was normal; there were no regional wall motion abnormalities. Left ventricular diastolic function parameters were normal. - Aortic valve: Trileaflet; normal thickness leaflets. No regurgitation. - Aortic root: The aortic root was normal in size. - Mitral valve: No regurgitation. - Left atrium: The atrium was mildly dilated. - Right ventricle: The cavity size was normal. Wall thickness was normal. Systolic function was normal. - Pulmonary arteries: Systolic pressure was within the normal range. - Inferior vena cava: The vessel was normal in size. - Pericardium, extracardiac: There  was no pericardial effusion. Impressions:  - Mildly dilated left atrium, otherwise normal echo   ASSESSMENT:   1. Paroxysmal atrial fibrillation with RVR now converted to NSR on IV Cardizem gtt. Initially in NSR on admit.  Mildly dilated left atrium, otherwise normal echocardiogram  2. Paroxysmal vertigo with nausea and vomiting   3. Abnormal EKG with baseline ST/T wave changes in a patient with a very strong family history of ASCAD with her sister have a fatal MI at age 72 and her dad with CAD and CABG. She has not had any CP until last evening when she had some chest pressure.   PLAN:  1. Change IV Cardizem to PO Cardizem - long acting 2. Pending stress test  4. Check TSH  5. Consider new anticoagulants   6. Workup of vertigo by Hospitalist  Signed, Ena Dawley, Lemmie Evens MD,  [facility] 07/14/2013

## 2013-07-15 MED ORDER — DILTIAZEM HCL ER COATED BEADS 120 MG PO CP24: 120.0000 mg | ORAL_CAPSULE | Freq: Every day | ORAL | Status: DC

## 2013-07-15 NOTE — Discharge Summary (Signed)
Physician Discharge Summary  Jewelle Rowberry [id number] DOB: [birth year] DOA: 07/12/2013  PCP: Sueanne Margarita, MD  Admit date: 07/12/2013 Discharge date: 07/15/2013  Time spent:21minutes  Recommendations for Outpatient Follow-up:   atrial fibrillation with a ventricular rate  -New onset atrial fib  -No risk factors  -Per cardiology patient to be discharged on Cardizem 120 mg daily   --Echocardiogram; see results below  - TSH; within normal limits  -Consulted Cardiology; performed nuclear medicine stress test; WNL, see results below  -Followup with PCP in one week -Followup with Dr. Fransico Him (cardiology) in one week per her instructions  Vertigo  -probably related to #1; resolved  -Any further episodes of vertigo to be worked up by PCP  Congestive heart failure (by chest x-ray )  -Not supported by echocardiogram  -Nuclear medicine stress test; see results below      Discharge Diagnoses:  Principal Problem:   Atrial fibrillation with RVR Active Problems:   Vertigo   Atrial fibrillation, rapid   Chest pain   Abnormal EKG   Discharge Condition: Stable   Diet recommendation: Heart healthy  Filed Weights   07/13/13 0222 07/13/13 1210  Weight: 99.791 kg (220 lb) 102.059 kg (225 lb)    History of present illness:  Teresa Little is a 48 y.o.BF PMHx none Presented today with dizziness and vomiting that started just after she finished her PM class. Patient denies any chest pains or palpitations. She denies fever or chills . Patient has been having similar episodes for the last few months and the last one was last week. No history of Coronary artery disease or atrial fibrillation. In the emergency room her rhythm suddenly went into atrial fib with rapid ventricular rate and she was started on Cardizem drip and I was called to admit. 1/27 although back in NSR still complaining of positive N./V., negative CP, negative SOB 1/28 patient sitting in bed comfortably negative CP, negative  SOB, negative palpitations S/P nuclear medicine stress test awaiting results/recommendations from cardiology 1/29 no complaints ready for discharge   Procedures: Nuclear medicine stress test 07/14/2013 There was mild decreased uptake along the distal anterior wall,  fixed, consistent with breast attenuation. Otherwise there was  homogeneous radiotracer uptake. No significant ischemia identified.  Normal wall motion with EF 64%  Echocardiogram 07/13/2013  - Left ventricle: The cavity size was normal. Systolic function was vigorous.  -LVEF=65% to 70%.  - Left ventricular diastolic function parameters were normal. - Aortic valve: Trileaflet; normal thickness leaflets. No regurgitation. - Aortic root: The aortic root was normal in size. - Mitral valve: No regurgitation. - Left atrium: The atrium was mildly dilated. - Right ventricle: The cavity size was normal.  - Pulmonary arteries: Systolic pressure was within the normal range. - Inferior vena cava: The vessel was normal in size. - Pericardium, extracardiac: There was no pericardial effusion.  [facility] 07/13/2013  Prominent vascular markings concerning for pulmonary edema.    Consultations: Dr. Fransico Him (cardiology)    Antibiotics    Discharge Exam: Filed Vitals:   07/14/13 2104 07/14/13 2220 07/15/13 0525 07/15/13 0948  BP: 130/70  110/67 130/76  Pulse: 63 70 61 68  Temp: 98.7 F (37.1 C)  97.8 F (36.6 C)   TempSrc: Oral  Oral   Resp: 16  16   Height:      Weight:      SpO2: 100%  99%     General: A/O X4, (+) N recent vomited  Cardiovascular: RRR, (-) M/R/G  Respiratory:  CTA Bilat  Abdomen: Soft, NT, ND, (+) BS  Musculoskeletal: (-) Pedal Edema   Discharge Instructions     Medication List    Notice   You have not been prescribed any medications.     No Known Allergies    The results of significant diagnostics from this hospitalization (including imaging, microbiology, ancillary and laboratory) are  listed below for reference.    Significant Diagnostic Studies: Nm Myocar Multi W/spect W/wall Motion / Ef  07/14/2013   CLINICAL DATA:  48 year old with AFIB, chest pain, abnormal ECG, FHX of CAD.  EXAM: MYOCARDIAL IMAGING WITH SPECT (REST AND PHARMACOLOGIC-STRESS)  GATED LEFT VENTRICULAR WALL MOTION STUDY  LEFT VENTRICULAR EJECTION FRACTION  TECHNIQUE: Standard myocardial SPECT imaging was performed after resting intravenous injection of 10 mCi Tc-35m sestamibi. Subsequently, intravenous infusion of Lexiscan was performed under the supervision of the Cardiology staff. At peak effect of the drug, 30 mCi Tc-76m sestamibi was injected intravenously and standard myocardial SPECT imaging was performed. Quantitative gated imaging was also performed to evaluate left ventricular wall motion, and estimate left ventricular ejection fraction.  COMPARISON:  None.  FINDINGS: There was mild decreased uptake along the distal anterior wall, fixed, consistent with breast attenuation. Otherwise there was homogeneous radiotracer uptake. No significant ischemia identified. Normal wall motion with EF 64%  IMPRESSION: Low risk perfusion study with no ischemia, normal EF 64%. Sensitivity mildly reduced by noted breast attenuation.   Electronically Signed   By: Candee Furbish   On: 07/14/2013 17:39   Dg Chest Port 1 View  07/13/2013   CLINICAL DATA:  Atrial fibrillation.  EXAM: PORTABLE CHEST - 1 VIEW  COMPARISON:  None available for comparison at time of study interpretation.  FINDINGS: Cardiomediastinal silhouette is unremarkable for this low inspiratory portable examination with crowded engorged vasculature markings. The lungs are clear without pleural effusions or focal consolidations. Trachea projects midline and there is no pneumothorax. Included soft tissue planes and osseous structures are non-suspicious. Multiple EKG lines overlie the patient and may obscure subtle underlying pathology.  IMPRESSION: Prominent vascular  markings concerning for pulmonary edema.   Electronically Signed   By: Elon Alas   On: 07/13/2013 01:23    Microbiology: No results found for this or any previous visit (from the past 240 hour(s)).   Labs: Basic Metabolic Panel:  Recent Labs Lab 07/12/13 2025 07/13/13 0525 07/14/13 1357  NA 142 137 142  K 3.1* 4.1 3.9  CL 105 101 102  CO2 21 22 24   GLUCOSE 136* 161* 81  BUN 10 6 9   CREATININE 0.59 0.49* 0.80  CALCIUM 8.4 9.1 9.8  MG  --   --  2.0   Liver Function Tests:  Recent Labs Lab 07/14/13 1357  AST 19  ALT 17  ALKPHOS 107  BILITOT 0.5  PROT 8.0  ALBUMIN 4.3   No results found for this basename: LIPASE, AMYLASE,  in the last 168 hours No results found for this basename: AMMONIA,  in the last 168 hours CBC:  Recent Labs Lab 07/12/13 2025 07/14/13 1357  WBC 9.3 6.7  NEUTROABS  --  3.4  HGB 12.7 14.5  HCT 38.6 43.7  MCV 88.3 87.8  PLT 184 233   Cardiac Enzymes:  Recent Labs Lab 07/13/13 0250 07/13/13 0853 07/13/13 1440 07/13/13 2340  TROPONINI <0.30 <0.30 <0.30 <0.30   BNP: BNP (last 3 results) No results found for this basename: PROBNP,  in the last 8760 hours CBG: No results found for  this basename: GLUCAP,  in the last 168 hours     Signed:  Dia Crawford, MD [facility] [phone] pager

## 2013-07-15 NOTE — Progress Notes (Signed)
Patient Name: Teresa Little Date of Encounter: 07/15/2013  Principal Problem:   Atrial fibrillation with RVR Active Problems:   Vertigo   Atrial fibrillation, rapid   Chest pain   Abnormal EKG   Length of Stay: 3  SUBJECTIVE  The patient feels well, no complains.   CURRENT MEDS . diltiazem  120 mg Oral Daily  . enoxaparin (LOVENOX) injection  50 mg Subcutaneous Q24H  . metoprolol tartrate  25 mg Oral BID  . ondansetron  4 mg Oral Q6H   Or  . ondansetron (ZOFRAN) IV  4 mg Intravenous Q6H  . potassium chloride  20 mEq Oral BID  . sodium chloride  3 mL Intravenous Q12H    OBJECTIVE  Filed Vitals:   07/14/13 1325 07/14/13 2104 07/14/13 2220 07/15/13 0525  BP: 107/68 130/70  110/67  Pulse: 68 63 70 61  Temp: 98.9 F (37.2 C) 98.7 F (37.1 C)  97.8 F (36.6 C)  TempSrc: Oral Oral  Oral  Resp: 16 16  16   Height:      Weight:      SpO2: 98% 100%  99%    Intake/Output Summary (Last 24 hours) at 07/15/13 0833 Last data filed at 07/14/13 2300  Gross per 24 hour  Intake    480 ml  Output      0 ml  Net    480 ml   Filed Weights   07/13/13 0222 07/13/13 1210  Weight: 220 lb (99.791 kg) 225 lb (102.059 kg)    PHYSICAL EXAM  General: Pleasant, NAD. Neuro: Alert and oriented X 3. Moves all extremities spontaneously. Psych: Normal affect. HEENT:  Normal  Neck: Supple without bruits or JVD. Lungs:  Resp regular and unlabored, CTA. Heart: RRR no s3, s4, or murmurs. Abdomen: Soft, non-tender, non-distended, BS + x 4.  Extremities: No clubbing, cyanosis or edema. DP/PT/Radials 2+ and equal bilaterally.  Accessory Clinical Findings  CBC  Recent Labs  07/12/13 2025 07/14/13 1357  WBC 9.3 6.7  NEUTROABS  --  3.4  HGB 12.7 14.5  HCT 38.6 43.7  MCV 88.3 87.8  PLT 184 277   Basic Metabolic Panel  Recent Labs  07/13/13 0525 07/14/13 1357  NA 137 142  K 4.1 3.9  CL 101 102  CO2 22 24  GLUCOSE 161* 81  BUN 6 9  CREATININE 0.49* 0.80  CALCIUM 9.1 9.8   MG  --  2.0   Cardiac Enzymes  Recent Labs  07/13/13 0853 07/13/13 1440 07/13/13 2340  TROPONINI <0.30 <0.30 <0.30    Recent Labs  07/13/13 1440  TSH 0.929    Radiology/Studies  Dg Chest Port 1 View  07/13/2013   CLINICAL DATA:  Atrial fibrillation.    IMPRESSION: Prominent vascular markings concerning for pulmonary edema.     TELE: SR  Echo 07/13/2013 Study Conclusions  - Left ventricle: The cavity size was normal. Systolic function was vigorous. The estimated ejection fraction was in the range of 65% to 70%. Wall motion was normal; there were no regional wall motion abnormalities. Left ventricular diastolic function parameters were normal. - Aortic valve: Trileaflet; normal thickness leaflets. No regurgitation. - Aortic root: The aortic root was normal in size. - Mitral valve: No regurgitation. - Left atrium: The atrium was mildly dilated. - Right ventricle: The cavity size was normal. Wall thickness was normal. Systolic function was normal. - Pulmonary arteries: Systolic pressure was within the normal range. - Inferior vena cava: The vessel was normal in size. -  Pericardium, extracardiac: There was no pericardial effusion. Impressions:  - Mildly dilated left atrium, otherwise normal echo   ASSESSMENT:   1. Paroxysmal atrial fibrillation with RVR now converted to NSR on IV Cardizem gtt. Initially in NSR on admit.  Mildly dilated left atrium, otherwise normal echocardiogram, normal TSH  2. Paroxysmal vertigo with nausea and vomiting   3. Abnormal EKG with baseline ST/T wave changes in a patient with a very strong family history of ASCAD - normal stress test, no further work up necessary   PLAN:  1. Change IV Cardizem to PO Cardizem - long acting, we will d/c metoprolol as she is bradycardic 2. Consider new anticoagulants   3. Workup of vertigo by Hospitalist  The patient can be discharged, please continue anticoagulation and arrange for an early  follow up visit at [facility].   Signed, Ena Dawley, H MD, [facility] 07/15/2013

## 2013-07-22 ENCOUNTER — Encounter: Payer: Self-pay | Admitting: Physician Assistant

## 2013-07-22 ENCOUNTER — Ambulatory Visit (INDEPENDENT_AMBULATORY_CARE_PROVIDER_SITE_OTHER): Payer: 59 | Admitting: Physician Assistant

## 2013-07-22 VITALS — BP 116/78 | HR 76 | Ht 70.0 in | Wt 247.0 lb

## 2013-07-22 DIAGNOSIS — Z8249 Family history of ischemic heart disease and other diseases of the circulatory system: Secondary | ICD-10-CM

## 2013-07-22 DIAGNOSIS — R42 Dizziness and giddiness: Secondary | ICD-10-CM

## 2013-07-22 DIAGNOSIS — I4891 Unspecified atrial fibrillation: Secondary | ICD-10-CM

## 2013-07-22 MED ORDER — DILTIAZEM HCL ER COATED BEADS 120 MG PO CP24: 120.0000 mg | ORAL_CAPSULE | Freq: Every day | ORAL | Status: DC

## 2013-07-22 NOTE — Progress Notes (Signed)
[place], [place], [place]  [address] Phone: [phone] Fax:  [phone]  Date:  07/22/2013   ID:  Teresa Little, DOB 09-11-1965, MRN [id number]  PCP:  Sueanne Margarita, MD  Cardiologist:  Dr. Fransico Him    History of Present Illness: Teresa Little is a 48 y.o. female who was recently seen in the hospital by Dr. Radford Pax for atrial fibrillation with RVR. She presented to the hospital with dizziness and vomiting that started abruptly.  She was dx with vertigo.  She converted to NSR on rate controlling therapy. Past medical history is essentially negative. ECG was abnormal with inferolateral T-wave inversions. Patient was noted have a strong family history of CAD and further workup was felt to be indicated.  CEs remained negative.  TSH was normal.  Echocardiogram was normal. Stress Myoview demonstrated no ischemia.  Echocardiogram (07/14/11): EF 65-70%, no WMA, normal diastolic function, mild LAE.   Myoview (07/15/11): No ischemia, EF 64%, low risk.  She is here for f/u.  Doing well.  No palpitations.  No chest pain, dyspnea, orthopnea, PND, edema, syncope.  She has had 2 episodes of dizziness in the last year.  They are described as spinning.    Recent Labs: 07/13/2013: TSH 0.929  07/14/2013: ALT 17; Creatinine 0.80; Hemoglobin 14.5; Potassium 3.9   Wt Readings from Last 3 Encounters:  07/22/13 247 lb (112.038 kg)  07/13/13 225 lb (102.059 kg)     No past medical history on file.  Current Outpatient Prescriptions  Medication Sig Dispense Refill  . diltiazem (CARDIZEM CD) 120 MG 24 hr capsule Take 1 capsule (120 mg total) by mouth daily.  30 capsule  0   No current facility-administered medications for this visit.    Allergies:   Review of patient's allergies indicates no known allergies.   Social History:  The patient  reports that she has never smoked. She has never used smokeless tobacco. She reports that she drinks alcohol. She reports that she does not use illicit  drugs.   Family History:  The patient's family history includes Coronary artery disease in her father; Heart attack in her sister; Heart disease in her father and sister; Hypertension in her mother.   ROS:  Please see the history of present illness.   She notes occasional snoring but no daytime hypersomnolence or witnessed apneic episodes.   All other systems reviewed and negative.   PHYSICAL EXAM: VS:  BP 116/78  Pulse 76  Ht 5\' 10"  (1.778 m)  Wt 247 lb (112.038 kg)  BMI 35.44 kg/m2 Well nourished, well developed, in no acute distress HEENT: normal Neck: no JVD Cardiac:  normal S1, S2; RRR; no murmur Lungs:  clear to auscultation bilaterally, no wheezing, rhonchi or rales Abd: soft, nontender, no hepatomegaly Ext: no edema Skin: warm and dry Neuro:  CNs 2-12 intact, no focal abnormalities noted  EKG:  NSR, HR 76, normal axis, inf-lat TWI, no change from prior     ASSESSMENT AND PLAN:  1. Atrial Fibrillation:  Lone episode.  Currently in NSR. CHADS2-VASc=0.  She does not require anticoagulation.  Continue Diltiazem.  If she has further episodes of AFib in the future, consider referral to EP.   2. Vertigo:  She likely has BPPV.  Refer to neurology for evaluation.  She may benefit from vestibular rehabilitation referral.  Defer to neurology. 3. Snoring:  Mild.  I have asked her to monitor symptoms.  If she identifies symptoms that are more suggestive of OSA,  she will need a sleep study. 4. Family History of CAD:  Arrange fasting lipids.   5. Disposition:  F/u with Dr. Fransico Him in 3 mos.   Signed, Richardson Dopp, PA-C  07/22/2013 10:00 AM

## 2013-07-22 NOTE — Patient Instructions (Addendum)
You have been referred to [facility] [place] physician recommends that you schedule a follow-up appointment in: 3 MONTHS WITH DR. Radford Pax  A REFILL FOR DILTIAZEM HAS BEEN SENT IN TO [facility] [address]  FASTING LIPID AND LIVER PANEL TO BE DONE AT [facility]

## 2013-07-26 ENCOUNTER — Ambulatory Visit (INDEPENDENT_AMBULATORY_CARE_PROVIDER_SITE_OTHER): Payer: 59 | Admitting: Diagnostic Neuroimaging

## 2013-07-26 ENCOUNTER — Encounter: Payer: Self-pay | Admitting: Diagnostic Neuroimaging

## 2013-07-26 ENCOUNTER — Ambulatory Visit (INDEPENDENT_AMBULATORY_CARE_PROVIDER_SITE_OTHER): Payer: 59

## 2013-07-26 VITALS — BP 120/88 | HR 94 | Ht 70.0 in | Wt 248.5 lb

## 2013-07-26 DIAGNOSIS — G459 Transient cerebral ischemic attack, unspecified: Secondary | ICD-10-CM

## 2013-07-26 DIAGNOSIS — Z8249 Family history of ischemic heart disease and other diseases of the circulatory system: Secondary | ICD-10-CM

## 2013-07-26 DIAGNOSIS — R42 Dizziness and giddiness: Secondary | ICD-10-CM

## 2013-07-26 LAB — HEPATIC FUNCTION PANEL
ALK PHOS: 86 U/L (ref 39–117)
ALT: 22 U/L (ref 0–35)
AST: 20 U/L (ref 0–37)
Albumin: 4.4 g/dL (ref 3.5–5.2)
BILIRUBIN DIRECT: 0 mg/dL (ref 0.0–0.3)
BILIRUBIN TOTAL: 0.6 mg/dL (ref 0.3–1.2)
Total Protein: 7.9 g/dL (ref 6.0–8.3)

## 2013-07-26 LAB — LIPID PANEL
CHOL/HDL RATIO: 4
Cholesterol: 219 mg/dL — ABNORMAL HIGH (ref 0–200)
HDL: 60.9 mg/dL (ref >39.00)
Triglycerides: 84 mg/dL (ref 0.0–149.0)
VLDL: 16.8 mg/dL (ref 0.0–40.0)

## 2013-07-26 MED ORDER — ASPIRIN 81 MG PO TBEC: 81.0000 mg | DELAYED_RELEASE_TABLET | Freq: Every day | ORAL | Status: AC

## 2013-07-26 NOTE — Progress Notes (Signed)
[facility]  PATIENT: Teresa Little DOB: [birth year]  REFERRING CLINICIAN: T Turner HISTORY FROM: patient  REASON FOR VISIT: new consult   HISTORICAL  CHIEF COMPLAINT:  Chief Complaint  Patient presents with  . Dizziness    HISTORY OF PRESENT ILLNESS:   48 year old right-handed female with history of paroxysmal a fibrillation, here for evaluation of vertigo.  June 2014 patient had first episode of sudden onset vertigo, room spinning, nausea. Symptoms occurred while she was at work sitting down. No positional finger. Symptoms lasted almost 24 hours. Patient did not seek medical attention at that time.  2 weeks ago patient had similar symptoms, without any triggering factors. Symptoms lasted for 1 day. His patient went to the hospital and was diagnosed with atrial fibrillation rapid ventricular response. This was treated with Cardizem drip and discharged on diltiazem. Anticoagulation was contemplated, but patient converted spontaneously to sinus rhythm.  Patient denies any dysphasia, dysarthria, lightheadedness, numbness, weakness, double vision or syncope. No episodes of unilateral numbness or weakness. No episodes of unilateral visual loss. Patient had some blurred vision during these vertigo attacks.  REVIEW OF SYSTEMS: Full 14 system review of systems performed and notable only for dizziness.  ALLERGIES: No Known Allergies  HOME MEDICATIONS: Outpatient Prescriptions Prior to Visit  Medication Sig Dispense Refill  . diltiazem (CARDIZEM CD) 120 MG 24 hr capsule Take 1 capsule (120 mg total) by mouth daily.  30 capsule  11   No facility-administered medications prior to visit.    PAST MEDICAL HISTORY: History reviewed. No pertinent past medical history.  PAST SURGICAL HISTORY: Past Surgical History  Procedure Laterality Date  . Abdominal hysterectomy  2005    FAMILY HISTORY: Family History  Problem Relation Age of Onset  . Hypertension Mother   .  Heart disease Father   . Coronary artery disease Father   . Heart attack Sister   . Heart disease Sister     SOCIAL HISTORY:  History   Social History  . Marital Status: Legally Separated    Spouse Name: N/A    Number of Children: 1  . Years of Education: College   Occupational History  .      [employer]   Social History Main Topics  . Smoking status: Never Smoker   . Smokeless tobacco: Never Used  . Alcohol Use: Yes     Comment: social  . Drug Use: No  . Sexual Activity: No   Other Topics Concern  . Not on file   Social History Narrative   Patient lives at home alone.   Caffeine Use: 1 cup daily     PHYSICAL EXAM  Filed Vitals:   07/26/13 0854 07/26/13 0902 07/26/13 0903  BP:  116/82 120/88  Pulse:  71 94  Height: 5\' 10"  (1.778 m)    Weight: 248 lb 8 oz (112.719 kg)      Not recorded    Body mass index is 35.66 kg/(m^2).  GENERAL EXAM: Patient is in no distress; well developed, nourished and groomed; neck is supple  CARDIOVASCULAR: Regular rate and rhythm, no murmurs, no carotid bruits  NEUROLOGIC: MENTAL STATUS: awake, alert, oriented to person, place and time, recent and remote memory intact, normal attention and concentration, language fluent, comprehension intact, naming intact, fund of knowledge appropriate CRANIAL NERVE: no papilledema on fundoscopic exam, pupils equal and reactive to light, visual fields full to confrontation, extraocular muscles intact, no nystagmus, facial sensation and strength symmetric, hearing intact, palate elevates symmetrically, uvula midline, shoulder  shrug symmetric, tongue midline. MOTOR: normal bulk and tone, full strength in the BUE, BLE SENSORY: normal and symmetric to light touch, temperature, vibration COORDINATION: finger-nose-finger, fine finger movements, heel-shin normal REFLEXES: deep tendon reflexes present and symmetric GAIT/STATION: narrow based gait; able to walk tandem; romberg is negative    DIAGNOSTIC  DATA (LABS, IMAGING, TESTING) - I reviewed patient records, labs, notes, testing and imaging myself where available.  Lab Results  Component Value Date   WBC 6.7 07/14/2013   HGB 14.5 07/14/2013   HCT 43.7 07/14/2013   MCV 87.8 07/14/2013   PLT 233 07/14/2013      Component Value Date/Time   NA 142 07/14/2013 1357   K 3.9 07/14/2013 1357   CL 102 07/14/2013 1357   CO2 24 07/14/2013 1357   GLUCOSE 81 07/14/2013 1357   BUN 9 07/14/2013 1357   CREATININE 0.80 07/14/2013 1357   CALCIUM 9.8 07/14/2013 1357   PROT 8.0 07/14/2013 1357   ALBUMIN 4.3 07/14/2013 1357   AST 19 07/14/2013 1357   ALT 17 07/14/2013 1357   ALKPHOS 107 07/14/2013 1357   BILITOT 0.5 07/14/2013 1357   GFRNONAA 86* 07/14/2013 1357   GFRAA >90 07/14/2013 1357   No results found for this basename: CHOL, HDL, LDLCALC, LDLDIRECT, TRIG, CHOLHDL   No results found for this basename: HGBA1C   No results found for this basename: VITAMINB12   Lab Results  Component Value Date   TSH 0.929 07/13/2013    ASSESSMENT AND PLAN  48 y.o. year old female here with vertigo attacks x 2, without positional trigger. Symptoms sudden onset, lasting 24 hours. Suspicious for vertebrobasilar ischemia (TIA vs stroke). Peripheral vestibulopathy or labyrinthitis are also possibilities.  CHA2DS2-VASc = 1 (for female gender)  HAS-BLED = 0  PLAN: - MRI, MRA head neck - start aspirin 81mg  daily - lipid and diabetes screening per PCP or cardiology   Orders Placed This Encounter  Procedures  . MR Brain Wo Contrast  . MR MRA HEAD WO CONTRAST  . MR Angiogram Neck W Wo Contrast    Meds ordered this encounter  Medications  . aspirin 81 MG EC tablet    Sig: Take 1 tablet (81 mg total) by mouth daily. Swallow whole.    Dispense:  30 tablet    Refill:  12    Return in about 6 weeks (around 09/06/2013).    Penni Bombard, MD 10/18/84, 7:61 AM Certified in Neurology, Neurophysiology and Neuroimaging  [facility] [address] [place], [place] [address] [phone]

## 2013-07-26 NOTE — Patient Instructions (Signed)

## 2013-07-27 ENCOUNTER — Telehealth: Payer: Self-pay | Admitting: *Deleted

## 2013-07-27 DIAGNOSIS — G459 Transient cerebral ischemic attack, unspecified: Secondary | ICD-10-CM | POA: Insufficient documentation

## 2013-07-27 LAB — LDL CHOLESTEROL, DIRECT: Direct LDL: 137.9 mg/dL

## 2013-07-27 NOTE — Telephone Encounter (Signed)
pt notified about lab results with verbalunderstanding to results and Plan of Care

## 2013-07-28 ENCOUNTER — Ambulatory Visit (INDEPENDENT_AMBULATORY_CARE_PROVIDER_SITE_OTHER): Payer: 59

## 2013-07-28 DIAGNOSIS — R42 Dizziness and giddiness: Secondary | ICD-10-CM

## 2013-07-28 DIAGNOSIS — G459 Transient cerebral ischemic attack, unspecified: Secondary | ICD-10-CM

## 2013-07-28 MED ORDER — GADOPENTETATE DIMEGLUMINE 469.01 MG/ML IV SOLN: 20.0000 mL | Freq: Once | INTRAVENOUS | Status: AC | PRN

## 2013-08-16 ENCOUNTER — Telehealth: Payer: Self-pay | Admitting: Physician Assistant

## 2013-08-16 NOTE — Telephone Encounter (Signed)
New message  Patient needs a RF on Diltiazem. Please call into [facility] ( [address]). Any questions please call patient.

## 2013-08-17 ENCOUNTER — Other Ambulatory Visit: Payer: Self-pay

## 2013-08-17 DIAGNOSIS — I4891 Unspecified atrial fibrillation: Secondary | ICD-10-CM

## 2013-08-17 MED ORDER — DILTIAZEM HCL ER COATED BEADS 120 MG PO CP24: 120.0000 mg | ORAL_CAPSULE | Freq: Every day | ORAL | Status: DC

## 2013-09-08 ENCOUNTER — Other Ambulatory Visit: Payer: Self-pay

## 2013-09-08 DIAGNOSIS — I4891 Unspecified atrial fibrillation: Secondary | ICD-10-CM

## 2013-09-08 MED ORDER — DILTIAZEM HCL ER COATED BEADS 120 MG PO CP24: 120.0000 mg | ORAL_CAPSULE | Freq: Every day | ORAL | Status: DC

## 2013-10-13 ENCOUNTER — Ambulatory Visit (INDEPENDENT_AMBULATORY_CARE_PROVIDER_SITE_OTHER): Payer: 59 | Admitting: Diagnostic Neuroimaging

## 2013-10-13 ENCOUNTER — Encounter: Payer: Self-pay | Admitting: Diagnostic Neuroimaging

## 2013-10-13 VITALS — BP 117/77 | HR 72 | Ht 70.0 in | Wt 236.0 lb

## 2013-10-13 DIAGNOSIS — I480 Paroxysmal atrial fibrillation: Secondary | ICD-10-CM | POA: Insufficient documentation

## 2013-10-13 DIAGNOSIS — R42 Dizziness and giddiness: Secondary | ICD-10-CM

## 2013-10-13 DIAGNOSIS — I4891 Unspecified atrial fibrillation: Secondary | ICD-10-CM

## 2013-10-13 NOTE — Progress Notes (Signed)
[facility]  PATIENT: Teresa Little DOB: 1966-04-01  REFERRING CLINICIAN:  HISTORY FROM: patient  REASON FOR VISIT: follow up   HISTORICAL  CHIEF COMPLAINT:  No chief complaint on file.   HISTORY OF PRESENT ILLNESS:   UPDATE 10/13/13: Doing well. No further vertigo, numbness, weakness, palpitations. Tolerating aspirin and diltiazem.  Test results reviewed.  PRIOR HPI (07/26/13): 48 year old right-handed female with history of paroxysmal atrial fibrillation, here for evaluation of vertigo. June 2014 patient had first episode of sudden onset vertigo, room spinning, nausea. Symptoms occurred while she was at work sitting down. No positional finger. Symptoms lasted almost 24 hours. Patient did not seek medical attention at that time. 2 weeks ago patient had similar symptoms, without any triggering factors. Symptoms lasted for 1 day. His patient went to the hospital and was diagnosed with atrial fibrillation rapid ventricular response. This was treated with Cardizem drip and discharged on diltiazem. Anticoagulation was contemplated, but patient converted spontaneously to sinus rhythm. Patient denies any dysphasia, dysarthria, lightheadedness, numbness, weakness, double vision or syncope. No episodes of unilateral numbness or weakness. No episodes of unilateral visual loss. Patient had some blurred vision during these vertigo attacks.  REVIEW OF SYSTEMS: Full 14 system review of systems performed and notable only for nothing.  ALLERGIES: No Known Allergies  HOME MEDICATIONS: Outpatient Prescriptions Prior to Visit  Medication Sig Dispense Refill  . aspirin 81 MG EC tablet Take 1 tablet (81 mg total) by mouth daily. Swallow whole.  30 tablet  12  . diltiazem (CARDIZEM CD) 120 MG 24 hr capsule Take 1 capsule (120 mg total) by mouth daily.  90 capsule  2   No facility-administered medications prior to visit.    PAST MEDICAL HISTORY: No past medical history on file.  PAST  SURGICAL HISTORY: Past Surgical History  Procedure Laterality Date  . Abdominal hysterectomy  2005    FAMILY HISTORY: Family History  Problem Relation Age of Onset  . Hypertension Mother   . Heart disease Father   . Coronary artery disease Father   . Heart attack Sister   . Heart disease Sister     SOCIAL HISTORY:  History   Social History  . Marital Status: Legally Separated    Spouse Name: N/A    Number of Children: 1  . Years of Education: College   Occupational History  .      [employer]   Social History Main Topics  . Smoking status: Never Smoker   . Smokeless tobacco: Never Used  . Alcohol Use: Yes     Comment: social  . Drug Use: No  . Sexual Activity: No   Other Topics Concern  . Not on file   Social History Narrative   Patient lives at home alone.   Caffeine Use: 1 cup daily     PHYSICAL EXAM  Filed Vitals:   10/13/13 0854  BP: 117/77  Pulse: 72  Height: 5\' 10"  (1.778 m)  Weight: 236 lb (107.049 kg)    Not recorded    Body mass index is 33.86 kg/(m^2).  GENERAL EXAM: Patient is in no distress; well developed, nourished and groomed; neck is supple  CARDIOVASCULAR: Regular rate and rhythm, no murmurs, no carotid bruits  NEUROLOGIC: MENTAL STATUS: awake, alert, oriented to person, place and time, recent and remote memory intact, normal attention and concentration, language fluent, comprehension intact, naming intact, fund of knowledge appropriate CRANIAL NERVE: no papilledema on fundoscopic exam, pupils equal and reactive to light, visual fields  full to confrontation, extraocular muscles intact, no nystagmus, facial sensation and strength symmetric, hearing intact, palate elevates symmetrically, uvula midline, shoulder shrug symmetric, tongue midline. MOTOR: normal bulk and tone, full strength in the BUE, BLE SENSORY: normal and symmetric to light touch, temperature, vibration COORDINATION: finger-nose-finger, fine finger movements  normal REFLEXES: deep tendon reflexes present and symmetric GAIT/STATION: narrow based gait; romberg is negative    DIAGNOSTIC DATA (LABS, IMAGING, TESTING) - I reviewed patient records, labs, notes, testing and imaging myself where available.  Lab Results  Component Value Date   WBC 6.7 07/14/2013   HGB 14.5 07/14/2013   HCT 43.7 07/14/2013   MCV 87.8 07/14/2013   PLT 233 07/14/2013      Component Value Date/Time   NA 142 07/14/2013 1357   K 3.9 07/14/2013 1357   CL 102 07/14/2013 1357   CO2 24 07/14/2013 1357   GLUCOSE 81 07/14/2013 1357   BUN 9 07/14/2013 1357   CREATININE 0.80 07/14/2013 1357   CALCIUM 9.8 07/14/2013 1357   PROT 7.9 07/26/2013 1236   ALBUMIN 4.4 07/26/2013 1236   AST 20 07/26/2013 1236   ALT 22 07/26/2013 1236   ALKPHOS 86 07/26/2013 1236   BILITOT 0.6 07/26/2013 1236   GFRNONAA 86* 07/14/2013 1357   GFRAA >90 07/14/2013 1357   Lab Results  Component Value Date   CHOL 219* 07/26/2013   No results found for this basename: HGBA1C   No results found for this basename: VITAMINB12   Lab Results  Component Value Date   TSH 0.929 07/13/2013    I reviewed images myself and agree with interpretation. -VRP  07/28/13 MRI brain - normal  07/28/13 MRA head 1. The left internal carotid artery, supraclinoid segment, has focal decreased signal. Normal signal distally up through the left carotid terminus. Could represent artifact or moderate atherosclerosis.  2. The remainder of medium-large sized vessels are normal. Posterior circulation is normal.  07/28/13 MRA neck - normal  07/13/13 TTE - EF 65-70%; mildly dilated left atrium, otherwise normal echocardiogram.   ASSESSMENT AND PLAN  48 y.o. year old female here with vertigo attacks x 2, without positional trigger. Symptoms sudden onset, lasting 24 hours. Suspicious for vertebrobasilar ischemia (TIA vs stroke), but peripheral vestibulopathy or labyrinthitis are also possibilities. MRI, MRA head/neck unremarkable except for mild  left ICA abnl vs artifact.   CHA2DS2-VASc = 1 (for female gender)  HAS-BLED = 0  PLAN: - continue aspirin 81mg  daily + diltazem - no anticoagulation for now (low risk re: atrial fibrillation) - lipid and diabetes screening per PCP or cardiology  Return in about 1 year (around 10/14/2014).   Penni Bombard, MD 8/85/0277, 4:12 AM Certified in Neurology, Neurophysiology and Neuroimaging  [facility] [address], [place], [place] [address] [phone]

## 2014-03-31 ENCOUNTER — Other Ambulatory Visit: Payer: Self-pay | Admitting: Cardiology

## 2014-09-27 ENCOUNTER — Other Ambulatory Visit: Payer: Self-pay | Admitting: Cardiology

## 2014-10-14 ENCOUNTER — Ambulatory Visit: Payer: 59 | Admitting: Diagnostic Neuroimaging

## 2014-10-24 ENCOUNTER — Encounter: Payer: Self-pay | Admitting: Diagnostic Neuroimaging

## 2014-12-26 ENCOUNTER — Other Ambulatory Visit: Payer: Self-pay

## 2014-12-26 ENCOUNTER — Other Ambulatory Visit: Payer: Self-pay | Admitting: Cardiology

## 2014-12-26 MED ORDER — DILTIAZEM HCL ER COATED BEADS 120 MG PO CP24: ORAL_CAPSULE | ORAL | Status: DC

## 2015-06-29 ENCOUNTER — Other Ambulatory Visit: Payer: Self-pay | Admitting: Nurse Practitioner

## 2015-06-29 DIAGNOSIS — Z1231 Encounter for screening mammogram for malignant neoplasm of breast: Secondary | ICD-10-CM

## 2015-07-04 ENCOUNTER — Ambulatory Visit: Payer: Self-pay

## 2015-07-07 ENCOUNTER — Ambulatory Visit
Admission: RE | Admit: 2015-07-07 | Discharge: 2015-07-07 | Disposition: A | Discharge status: Home / Self Care | Payer: 59 | Source: Ambulatory Visit | Attending: Nurse Practitioner | Admitting: Nurse Practitioner

## 2015-07-07 DIAGNOSIS — Z1231 Encounter for screening mammogram for malignant neoplasm of breast: Secondary | ICD-10-CM

## 2017-04-23 ENCOUNTER — Other Ambulatory Visit: Payer: Self-pay | Admitting: Nurse Practitioner

## 2017-04-23 DIAGNOSIS — Z139 Encounter for screening, unspecified: Secondary | ICD-10-CM

## 2017-05-22 ENCOUNTER — Ambulatory Visit: Payer: 59

## 2017-09-19 DIAGNOSIS — Z0000 Encounter for general adult medical examination without abnormal findings: Secondary | ICD-10-CM | POA: Diagnosis not present

## 2017-09-19 DIAGNOSIS — R000 Tachycardia, unspecified: Secondary | ICD-10-CM | POA: Diagnosis not present

## 2017-09-19 DIAGNOSIS — Z1211 Encounter for screening for malignant neoplasm of colon: Secondary | ICD-10-CM | POA: Diagnosis not present

## 2017-09-19 DIAGNOSIS — Z23 Encounter for immunization: Secondary | ICD-10-CM | POA: Diagnosis not present

## 2017-09-19 DIAGNOSIS — Z1231 Encounter for screening mammogram for malignant neoplasm of breast: Secondary | ICD-10-CM | POA: Diagnosis not present

## 2017-10-21 ENCOUNTER — Encounter: Payer: Self-pay | Admitting: Gastroenterology

## 2017-12-16 ENCOUNTER — Other Ambulatory Visit: Payer: Self-pay

## 2017-12-16 ENCOUNTER — Ambulatory Visit (AMBULATORY_SURGERY_CENTER): Payer: Self-pay

## 2017-12-16 VITALS — Ht 71.0 in | Wt 267.4 lb

## 2017-12-16 DIAGNOSIS — Z1211 Encounter for screening for malignant neoplasm of colon: Secondary | ICD-10-CM

## 2017-12-16 MED ORDER — NA SULFATE-K SULFATE-MG SULF 17.5-3.13-1.6 GM/177ML PO SOLN: 1.0000 | Freq: Once | ORAL | 0 refills | Status: AC

## 2017-12-16 NOTE — Progress Notes (Signed)
Denies allergies to eggs or soy products. Denies complication of anesthesia or sedation. Denies use of weight loss medication. Denies use of O2.   Emmi instructions declined.  

## 2017-12-30 ENCOUNTER — Encounter: Payer: 59 | Admitting: Gastroenterology

## 2018-01-26 ENCOUNTER — Encounter: Payer: Self-pay | Admitting: Gastroenterology

## 2018-02-03 ENCOUNTER — Encounter: Payer: Self-pay | Admitting: Gastroenterology

## 2018-02-03 ENCOUNTER — Ambulatory Visit (AMBULATORY_SURGERY_CENTER): Payer: BLUE CROSS/BLUE SHIELD | Admitting: Gastroenterology

## 2018-02-03 VITALS — BP 134/86 | HR 72 | Temp 98.2°F | Resp 15 | Ht 71.0 in | Wt 267.0 lb

## 2018-02-03 DIAGNOSIS — D124 Benign neoplasm of descending colon: Secondary | ICD-10-CM

## 2018-02-03 DIAGNOSIS — Z1211 Encounter for screening for malignant neoplasm of colon: Secondary | ICD-10-CM | POA: Diagnosis not present

## 2018-02-03 HISTORY — PX: COLONOSCOPY: SHX174

## 2018-02-03 MED ORDER — SODIUM CHLORIDE 0.9 % IV SOLN: 500.0000 mL | Freq: Once | INTRAVENOUS | Status: DC

## 2018-02-03 NOTE — Progress Notes (Signed)
Called to room to assist during endoscopic procedure.  Patient ID and intended procedure confirmed with present staff. Received instructions for my participation in the procedure from the performing physician.  

## 2018-02-03 NOTE — Progress Notes (Signed)
To PACU, vss patent aw report to rn 

## 2018-02-03 NOTE — Op Note (Addendum)
[facility] Patient Name: Teresa Little Procedure Date: 02/03/2018 9:00 AM MRN: [id number] Endoscopist: Mallie Mussel L. Loletha Carrow , MD Age: 52 Referring MD:  Date of Birth: 08/02/1965 Gender: Female Account #: [id number] Procedure:                Colonoscopy Indications:              Screening for colorectal malignant neoplasm, This                            is the patient's first colonoscopy Medicines:                Monitored Anesthesia Care Procedure:                Pre-Anesthesia Assessment:                           - Prior to the procedure, a History and Physical                            was performed, and patient medications and                            allergies were reviewed. The patient's tolerance of                            previous anesthesia was also reviewed. The risks                            and benefits of the procedure and the sedation                            options and risks were discussed with the patient.                            All questions were answered, and informed consent                            was obtained. Anticoagulants: The patient has taken                            aspirin. It was decided not to withhold this                            medication prior to the procedure. ASA Grade                            Assessment: II - A patient with mild systemic                            disease. After reviewing the risks and benefits,                            the patient was deemed in satisfactory condition to  undergo the procedure.                           After obtaining informed consent, the colonoscope                            was passed under direct vision. Throughout the                            procedure, the patient's blood pressure, pulse, and                            oxygen saturations were monitored continuously. The                            Model [id number]-[id number]) scope was introduced                           through the anus and advanced to the the cecum,                            identified by appendiceal orifice and ileocecal                            valve. The colonoscopy was performed without                            difficulty. The patient tolerated the procedure                            well. The quality of the bowel preparation was                            good. The ileocecal valve, appendiceal orifice, and                            rectum were photographed. The quality of the bowel                            preparation was evaluated using the BBPS [facility]                            Bowel Preparation Scale) with scores of: Right                            Colon = 2, Transverse Colon = 2 and Left Colon = 2.                            The total BBPS score equals 6. Scope In: 9:09:54 AM Scope Out: 9:29:12 AM Scope Withdrawal Time: 0 hours 14 minutes 4 seconds  Total Procedure Duration: 0 hours 19 minutes 18 seconds  Findings:                 The perianal and digital rectal examinations were  normal.                           A 4 mm polyp was found in the descending colon. The                            polyp was sessile. The polyp was removed with a                            cold snare. Resection and retrieval were complete.                           The exam was otherwise without abnormality on                            direct and retroflexion views. Complications:            No immediate complications. Estimated Blood Loss:     Estimated blood loss was minimal. Impression:               - One 4 mm polyp in the descending colon, removed                            with a cold snare. Resected and retrieved.                           - The examination was otherwise normal on direct                            and retroflexion views. Recommendation:           - Patient has a contact number available for                             emergencies. The signs and symptoms of potential                            delayed complications were discussed with the                            patient. Return to normal activities tomorrow.                            Written discharge instructions were provided to the                            patient.                           - Resume previous diet.                           - Continue present medications.                           - Await pathology results.                           -  Repeat colonoscopy is recommended for                            surveillance. The colonoscopy date will be                            determined after pathology results from today's                            exam become available for review. Henry L. Loletha Carrow, MD 02/03/2018 9:32:40 AM This report has been signed electronically.

## 2018-02-03 NOTE — Patient Instructions (Signed)
YOU HAD AN ENDOSCOPIC PROCEDURE TODAY AT THE [facility]:   Refer to the procedure report that was given to you for any specific questions about what was found during the examination.  If the procedure report does not answer your questions, please call your gastroenterologist to clarify.  If you requested that your care partner not be given the details of your procedure findings, then the procedure report has been included in a sealed envelope for you to review at your convenience later.  YOU SHOULD EXPECT: Some feelings of bloating in the abdomen. Passage of more gas than usual.  Walking can help get rid of the air that was put into your GI tract during the procedure and reduce the bloating. If you had a lower endoscopy (such as a colonoscopy or flexible sigmoidoscopy) you may notice spotting of blood in your stool or on the toilet paper. If you underwent a bowel prep for your procedure, you may not have a normal bowel movement for a few days.  Please Note:  You might notice some irritation and congestion in your nose or some drainage.  This is from the oxygen used during your procedure.  There is no need for concern and it should clear up in a day or so.  SYMPTOMS TO REPORT IMMEDIATELY:   Following lower endoscopy (colonoscopy or flexible sigmoidoscopy):  Excessive amounts of blood in the stool  Significant tenderness or worsening of abdominal pains  Swelling of the abdomen that is new, acute  Fever of 100F or higher   For urgent or emergent issues, a gastroenterologist can be reached at any hour by calling [phone].   DIET:  We do recommend a small meal at first, but then you may proceed to your regular diet.  Drink plenty of fluids but you should avoid alcoholic beverages for 24 hours.  ACTIVITY:  You should plan to take it easy for the rest of today and you should NOT DRIVE or use heavy machinery until tomorrow (because of the sedation medicines used during the test).     FOLLOW UP: Our staff will call the number listed on your records the next business day following your procedure to check on you and address any questions or concerns that you may have regarding the information given to you following your procedure. If we do not reach you, we will leave a message.  However, if you are feeling well and you are not experiencing any problems, there is no need to return our call.  We will assume that you have returned to your regular daily activities without incident.  If any biopsies were taken you will be contacted by phone or by letter within the next 1-3 weeks.  Please call us at [phone] if you have not heard about the biopsies in 3 weeks.    SIGNATURES/CONFIDENTIALITY: You and/or your care partner have signed paperwork which will be entered into your electronic medical record.  These signatures attest to the fact that that the information above on your After Visit Summary has been reviewed and is understood.  Full responsibility of the confidentiality of this discharge information lies with you and/or your care-partner.  Polyp information given. 

## 2018-02-03 NOTE — Progress Notes (Signed)
Pt's states no medical or surgical changes since previsit or office visit. 

## 2018-02-04 ENCOUNTER — Telehealth: Payer: Self-pay

## 2018-02-04 NOTE — Telephone Encounter (Signed)
Second follow up phone call attempt, message left.

## 2018-02-04 NOTE — Telephone Encounter (Signed)
NO ANSWER, MESSAGE LEFT FOR PATIENT. 

## 2018-02-05 ENCOUNTER — Encounter: Payer: Self-pay | Admitting: Gastroenterology

## 2018-04-01 ENCOUNTER — Other Ambulatory Visit: Payer: Self-pay | Admitting: Nurse Practitioner

## 2018-04-01 DIAGNOSIS — Z1231 Encounter for screening mammogram for malignant neoplasm of breast: Secondary | ICD-10-CM

## 2018-05-08 ENCOUNTER — Ambulatory Visit
Admission: RE | Admit: 2018-05-08 | Discharge: 2018-05-08 | Disposition: A | Discharge status: Home / Self Care | Payer: BLUE CROSS/BLUE SHIELD | Source: Ambulatory Visit | Attending: Nurse Practitioner | Admitting: Nurse Practitioner

## 2018-05-08 DIAGNOSIS — Z1231 Encounter for screening mammogram for malignant neoplasm of breast: Secondary | ICD-10-CM

## 2018-09-24 ENCOUNTER — Encounter: Payer: Self-pay | Admitting: Nurse Practitioner

## 2019-07-09 DIAGNOSIS — I10 Essential (primary) hypertension: Secondary | ICD-10-CM | POA: Diagnosis not present

## 2019-09-09 ENCOUNTER — Ambulatory Visit: Payer: Self-pay | Attending: Family

## 2019-09-09 DIAGNOSIS — Z23 Encounter for immunization: Secondary | ICD-10-CM

## 2019-09-09 NOTE — Progress Notes (Signed)
   Covid-19 Vaccination Clinic  Name:  Karolyn Bragdon    MRN: [id number] DOB: 06-05-1966  09/09/2019  Ms. Deline was observed post Covid-19 immunization for 15 minutes without incident. She was provided with Vaccine Information Sheet and instruction to access the V-Safe system.   Ms. Labean was instructed to call 911 with any severe reactions post vaccine: Marland Kitchen Difficulty breathing  . Swelling of face and throat  . A fast heartbeat  . A bad rash all over body  . Dizziness and weakness   Immunizations Administered    Name Date Dose VIS Date Route   Moderna COVID-19 Vaccine 09/09/2019 10:15 AM 0.5 mL 05/18/2019 Intramuscular   Manufacturer: Moderna   Lot: [id number]   [id number]

## 2019-10-08 DIAGNOSIS — E669 Obesity, unspecified: Secondary | ICD-10-CM | POA: Diagnosis not present

## 2019-10-08 DIAGNOSIS — I10 Essential (primary) hypertension: Secondary | ICD-10-CM | POA: Diagnosis not present

## 2019-10-12 ENCOUNTER — Ambulatory Visit: Payer: Self-pay | Attending: Family

## 2019-10-12 DIAGNOSIS — Z23 Encounter for immunization: Secondary | ICD-10-CM

## 2019-10-12 NOTE — Progress Notes (Signed)
   Covid-19 Vaccination Clinic  Name:  Teresa Little    MRN: [id number] DOB: 1965/11/05  10/12/2019  [name] was observed post Covid-19 immunization for 15 minutes without incident. She was provided with Vaccine Information Sheet and instruction to access the V-Safe system.   [name] was instructed to call 911 with any severe reactions post vaccine: Marland Kitchen Difficulty breathing  . Swelling of face and throat  . A fast heartbeat  . A bad rash all over body  . Dizziness and weakness   Immunizations Administered    Name Date Dose VIS Date Route   Moderna COVID-19 Vaccine 10/12/2019 10:06 AM 0.5 mL 05/2019 Intramuscular   Manufacturer: Moderna   Lot: [id number]   [id number]

## 2020-04-11 DIAGNOSIS — R7301 Impaired fasting glucose: Secondary | ICD-10-CM | POA: Diagnosis not present

## 2020-04-11 DIAGNOSIS — I10 Essential (primary) hypertension: Secondary | ICD-10-CM | POA: Diagnosis not present

## 2020-11-30 ENCOUNTER — Other Ambulatory Visit: Payer: Self-pay | Admitting: Obstetrics and Gynecology

## 2020-11-30 DIAGNOSIS — Z1231 Encounter for screening mammogram for malignant neoplasm of breast: Secondary | ICD-10-CM

## 2021-01-25 ENCOUNTER — Other Ambulatory Visit: Payer: Self-pay

## 2021-01-25 ENCOUNTER — Ambulatory Visit
Admission: RE | Admit: 2021-01-25 | Discharge: 2021-01-25 | Disposition: A | Discharge status: Home / Self Care | Payer: BC Managed Care – PPO | Source: Ambulatory Visit | Attending: Obstetrics and Gynecology | Admitting: Obstetrics and Gynecology

## 2021-01-25 DIAGNOSIS — Z1231 Encounter for screening mammogram for malignant neoplasm of breast: Secondary | ICD-10-CM

## 2022-03-06 ENCOUNTER — Other Ambulatory Visit: Payer: Self-pay | Admitting: Obstetrics and Gynecology

## 2022-03-06 DIAGNOSIS — Z1231 Encounter for screening mammogram for malignant neoplasm of breast: Secondary | ICD-10-CM

## 2022-04-01 ENCOUNTER — Ambulatory Visit
Admission: RE | Admit: 2022-04-01 | Discharge: 2022-04-01 | Disposition: A | Discharge status: Home / Self Care | Payer: BC Managed Care – PPO | Source: Ambulatory Visit | Attending: Obstetrics and Gynecology | Admitting: Obstetrics and Gynecology

## 2022-04-01 DIAGNOSIS — Z1231 Encounter for screening mammogram for malignant neoplasm of breast: Secondary | ICD-10-CM

## 2022-11-13 ENCOUNTER — Encounter: Payer: Self-pay | Admitting: Gastroenterology

## 2023-01-17 ENCOUNTER — Encounter: Payer: Self-pay | Admitting: Gastroenterology

## 2023-02-21 ENCOUNTER — Ambulatory Visit (AMBULATORY_SURGERY_CENTER): Payer: BC Managed Care – PPO

## 2023-02-21 VITALS — Ht 70.0 in | Wt 251.0 lb

## 2023-02-21 DIAGNOSIS — Z86010 Personal history of colonic polyps: Secondary | ICD-10-CM

## 2023-02-21 MED ORDER — NA SULFATE-K SULFATE-MG SULF 17.5-3.13-1.6 GM/177ML PO SOLN
1.0000 | Freq: Once | ORAL | 0 refills | Status: AC
Start: 2023-02-21 — End: 2023-02-21

## 2023-02-21 NOTE — Progress Notes (Signed)
 No egg or soy allergy known to patient  No issues known to pt with past sedation with any surgeries or procedures Patient denies ever being told they had issues or difficulty with intubation  No FH of Malignant Hyperthermia Pt is not on diet pills Pt is not on  home 02  Pt is not on blood thinners  Pt denies issues with constipation  No A fib or A flutter Have any cardiac testing pending--no  LOA: independent  Prep: suprep   Patient's chart reviewed by Cathlyn Parsons CNRA prior to previsit and patient appropriate for the [facility].  Previsit completed and red dot placed by patient's name on their procedure day (on provider's schedule).     PV competed with patient. Prep instructions sent via mychart

## 2023-02-26 ENCOUNTER — Encounter: Payer: Self-pay | Admitting: Gastroenterology

## 2023-03-11 ENCOUNTER — Encounter: Payer: Self-pay | Admitting: Gastroenterology

## 2023-03-11 ENCOUNTER — Ambulatory Visit (AMBULATORY_SURGERY_CENTER): Payer: BC Managed Care – PPO | Admitting: Gastroenterology

## 2023-03-11 VITALS — BP 115/74 | HR 83 | Temp 98.1°F | Resp 12 | Ht 70.0 in | Wt 251.0 lb

## 2023-03-11 DIAGNOSIS — Z09 Encounter for follow-up examination after completed treatment for conditions other than malignant neoplasm: Secondary | ICD-10-CM

## 2023-03-11 DIAGNOSIS — Z86010 Personal history of colonic polyps: Secondary | ICD-10-CM

## 2023-03-11 DIAGNOSIS — D122 Benign neoplasm of ascending colon: Secondary | ICD-10-CM

## 2023-03-11 DIAGNOSIS — D124 Benign neoplasm of descending colon: Secondary | ICD-10-CM | POA: Diagnosis not present

## 2023-03-11 MED ORDER — SODIUM CHLORIDE 0.9 % IV SOLN: 500.0000 mL | Freq: Once | INTRAVENOUS | Status: DC

## 2023-03-11 NOTE — Progress Notes (Signed)
Vss nad trans to pacu 

## 2023-03-11 NOTE — Progress Notes (Signed)
History and Physical:  This patient presents for endoscopic testing for: Encounter Diagnosis  Name Primary?   Personal history of colonic polyps Yes    Surveillance colonoscopy today Diminutive TA Aug 2019 Patient denies chronic abdominal pain, rectal bleeding, constipation or diarrhea.   Patient is otherwise without complaints or active issues today.   Past Medical History: Past Medical History:  Diagnosis Date   Allergy    Borderline diabetes    Hypertension      Past Surgical History: Past Surgical History:  Procedure Laterality Date   ABDOMINAL HYSTERECTOMY  06/18/2003   COLONOSCOPY  02/03/2018   Dr. Myrtie Neither    Allergies: No Known Allergies  Outpatient Meds: Current Outpatient Medications  Medication Sig Dispense Refill   aspirin 81 MG EC tablet Take 1 tablet (81 mg total) by mouth daily. Swallow whole. 30 tablet 12   chlorthalidone (HYGROTON) 25 MG tablet Take 1 tablet by mouth daily.     Cholecalciferol 250 MCG (10000 UT) CAPS Take 1 capsule by mouth daily.     DOTTI 0.025 MG/24HR Place 1 patch onto the skin 2 (two) times a week.     Methylcobalamin (B12) 5000 MCG SUBL Place 1 tablet under the tongue daily.     Multiple Vitamins-Minerals (ONE DAILY FOR WOMEN 50+ ADV) TABS Take 1 tablet by mouth daily.     TURMERIC-GINGER PO Take 2,400 mg by mouth daily.     cetirizine (ZYRTEC) 10 MG chewable tablet Chew 10 mg by mouth daily. (Patient not taking: Reported on 03/11/2023)     Elderberry 500 MG CAPS Take 1 capsule by mouth daily.     MOUNJARO 2.5 MG/0.5ML Pen Inject 2.5 mg into the skin once a week.     OVER THE COUNTER MEDICATION Krill Oil, one capsule daily. (Patient not taking: Reported on 03/11/2023)     OVER THE COUNTER MEDICATION Vitamin C, one tablet daily. (Patient not taking: Reported on 02/21/2023)     Current Facility-Administered Medications  Medication Dose Route Frequency Provider Last Rate Last Admin   0.9 %  sodium chloride infusion  500 mL Intravenous  Once Danis, Andreas Blower, MD          ___________________________________________________________________ Objective   Exam:  BP (!) 167/97   Pulse 97   Temp 98.1 F (36.7 C) (Skin)   Ht 5\' 10"  (1.778 m)   Wt 251 lb (113.9 kg)   SpO2 96%   BMI 36.01 kg/m   CV: regular , S1/S2 Resp: clear to auscultation bilaterally, normal RR and effort noted GI: soft, no tenderness, with active bowel sounds.   Assessment: Encounter Diagnosis  Name Primary?   Personal history of colonic polyps Yes     Plan: Colonoscopy   The benefits and risks of the planned procedure were described in detail with the patient or (when appropriate) their health care proxy.  Risks were outlined as including, but not limited to, bleeding, infection, perforation, adverse medication reaction leading to cardiac or pulmonary decompensation, pancreatitis (if ERCP).  The limitation of incomplete mucosal visualization was also discussed.  No guarantees or warranties were given.  The patient is appropriate for an endoscopic procedure in the ambulatory setting.   - Amada Jupiter, MD

## 2023-03-11 NOTE — Op Note (Signed)
[facility] Patient Name: Teresa Little Procedure Date: 03/11/2023 8:41 AM MRN: [id number] Endoscopist: Sherilyn Cooter L. Myrtie Neither , MD, [id number] Age: 57 Referring MD:  Date of Birth: 03/31/1966 Gender: Female Account #: [id number] Procedure:                Colonoscopy Indications:              Surveillance: Personal history of adenomatous                            polyps on last colonoscopy 5 years ago                           Diminutive tubular adenoma August 2019 Medicines:                Monitored Anesthesia Care Procedure:                Pre-Anesthesia Assessment:                           - Prior to the procedure, a History and Physical                            was performed, and patient medications and                            allergies were reviewed. The patient's tolerance of                            previous anesthesia was also reviewed. The risks                            and benefits of the procedure and the sedation                            options and risks were discussed with the patient.                            All questions were answered, and informed consent                            was obtained. Prior Anticoagulants: The patient has                            taken no anticoagulant or antiplatelet agents. ASA                            Grade Assessment: II - A patient with mild systemic                            disease. After reviewing the risks and benefits,                            the patient was deemed in satisfactory condition to  undergo the procedure.                           After obtaining informed consent, the colonoscope                            was passed under direct vision. Throughout the                            procedure, the patient's blood pressure, pulse, and                            oxygen saturations were monitored continuously. The                            Olympus CF-HQ190L ([id number])  Colonoscope was                            introduced through the anus and advanced to the the                            cecum, identified by appendiceal orifice and                            ileocecal valve. The colonoscopy was somewhat                            difficult due to a redundant colon and significant                            looping. Successful completion of the procedure was                            aided by using manual pressure and straightening                            and shortening the scope to obtain bowel loop                            reduction. The patient tolerated the procedure                            well. The quality of the bowel preparation was                            good. The ileocecal valve, appendiceal orifice, and                            rectum were photographed. Scope In: 8:54:04 AM Scope Out: 9:14:03 AM Scope Withdrawal Time: 0 hours 11 minutes 5 seconds  Total Procedure Duration: 0 hours 19 minutes 59 seconds  Findings:                 The perianal and digital rectal examinations were  normal.                           Repeat examination of right colon under NBI                            performed.                           Two sessile polyps were found in the descending                            colon and ascending colon. The polyps were 4 to 6                            mm in size. These polyps were removed with a cold                            snare. Resection and retrieval were complete.                           The exam was otherwise without abnormality on                            direct and retroflexion views. Complications:            No immediate complications. Estimated Blood Loss:     Estimated blood loss was minimal. Impression:               - Two 4 to 6 mm polyps in the descending colon and                            in the ascending colon, removed with a cold snare.                             Resected and retrieved.                           - The examination was otherwise normal on direct                            and retroflexion views. Recommendation:           - Patient has a contact number available for                            emergencies. The signs and symptoms of potential                            delayed complications were discussed with the                            patient. Return to normal activities tomorrow.                            Written discharge  instructions were provided to the                            patient.                           - Resume previous diet.                           - Continue present medications.                           - Await pathology results.                           - Repeat colonoscopy is recommended for                            surveillance. The colonoscopy date will be                            determined after pathology results from today's                            exam become available for review. Majid Mccravy L. Myrtie Neither, MD 03/11/2023 9:18:26 AM This report has been signed electronically.

## 2023-03-11 NOTE — Patient Instructions (Signed)
Resume all of your previous medications today as ordered.  Read the handouts given to you by your recovery room nurse.    YOU HAD AN ENDOSCOPIC PROCEDURE TODAY AT THE [facility]:   Refer to the procedure report that was given to you for any specific questions about what was found during the examination.  If the procedure report does not answer your questions, please call your gastroenterologist to clarify.  If you requested that your care partner not be given the details of your procedure findings, then the procedure report has been included in a sealed envelope for you to review at your convenience later.  YOU SHOULD EXPECT: Some feelings of bloating in the abdomen. Passage of more gas than usual.  Walking can help get rid of the air that was put into your GI tract during the procedure and reduce the bloating. If you had a lower endoscopy (such as a colonoscopy or flexible sigmoidoscopy) you may notice spotting of blood in your stool or on the toilet paper. If you underwent a bowel prep for your procedure, you may not have a normal bowel movement for a few days.  Please Note:  You might notice some irritation and congestion in your nose or some drainage.  This is from the oxygen used during your procedure.  There is no need for concern and it should clear up in a day or so.  SYMPTOMS TO REPORT IMMEDIATELY:  Following lower endoscopy (colonoscopy or flexible sigmoidoscopy):  Excessive amounts of blood in the stool  Significant tenderness or worsening of abdominal pains  Swelling of the abdomen that is new, acute  Fever of 100F or above    For urgent or emergent issues, a gastroenterologist can be reached at any hour by calling [phone]. Do not use MyChart messaging for urgent concerns.    DIET:  We do recommend a small meal at first, but then you may proceed to your regular diet.  Drink plenty of fluids but you should avoid alcoholic beverages for 24 hours.  ACTIVITY:  You  should plan to take it easy for the rest of today and you should NOT DRIVE or use heavy machinery until tomorrow (because of the sedation medicines used during the test).    FOLLOW UP: Our staff will call the number listed on your records the next business day following your procedure.  We will call around 7:15- 8:00 am to check on you and address any questions or concerns that you may have regarding the information given to you following your procedure. If we do not reach you, we will leave a message.     If any biopsies were taken you will be contacted by phone or by letter within the next 1-3 weeks.  Please call us at [phone] if you have not heard about the biopsies in 3 weeks.    SIGNATURES/CONFIDENTIALITY: You and/or your care partner have signed paperwork which will be entered into your electronic medical record.  These signatures attest to the fact that that the information above on your After Visit Summary has been reviewed and is understood.  Full responsibility of the confidentiality of this discharge information lies with you and/or your care-partner.

## 2023-03-11 NOTE — Progress Notes (Signed)
Called to room to assist during endoscopic procedure.  Patient ID and intended procedure confirmed with present staff. Received instructions for my participation in the procedure from the performing physician.  

## 2023-03-12 ENCOUNTER — Telehealth: Payer: Self-pay

## 2023-03-12 NOTE — Telephone Encounter (Signed)
Left message on follow up call.

## 2023-03-13 ENCOUNTER — Encounter: Payer: Self-pay | Admitting: Gastroenterology

## 2023-03-13 LAB — SURGICAL PATHOLOGY

## 2023-04-17 ENCOUNTER — Other Ambulatory Visit: Payer: Self-pay | Admitting: Obstetrics and Gynecology

## 2023-04-17 DIAGNOSIS — Z1231 Encounter for screening mammogram for malignant neoplasm of breast: Secondary | ICD-10-CM

## 2023-04-21 ENCOUNTER — Ambulatory Visit
Admission: RE | Admit: 2023-04-21 | Discharge: 2023-04-21 | Disposition: A | Discharge status: Home / Self Care | Payer: BC Managed Care – PPO | Source: Ambulatory Visit | Attending: Obstetrics and Gynecology | Admitting: Obstetrics and Gynecology

## 2023-04-21 DIAGNOSIS — Z1231 Encounter for screening mammogram for malignant neoplasm of breast: Secondary | ICD-10-CM

## 2023-06-09 IMAGING — MG MM DIGITAL SCREENING BILAT W/ TOMO AND CAD
8 series · 8 of 24 positions shown · non-contrast
Comparison: Previous exam(s).

CLINICAL DATA: Screening.

EXAM:
DIGITAL SCREENING BILATERAL MAMMOGRAM WITH TOMOSYNTHESIS AND CAD
TECHNIQUE: Bilateral screening digital craniocaudal and mediolateral oblique
mammograms were obtained. Bilateral screening digital breast
tomosynthesis was performed. The images were evaluated with
computer-aided detection.

[L CC synth-2D]
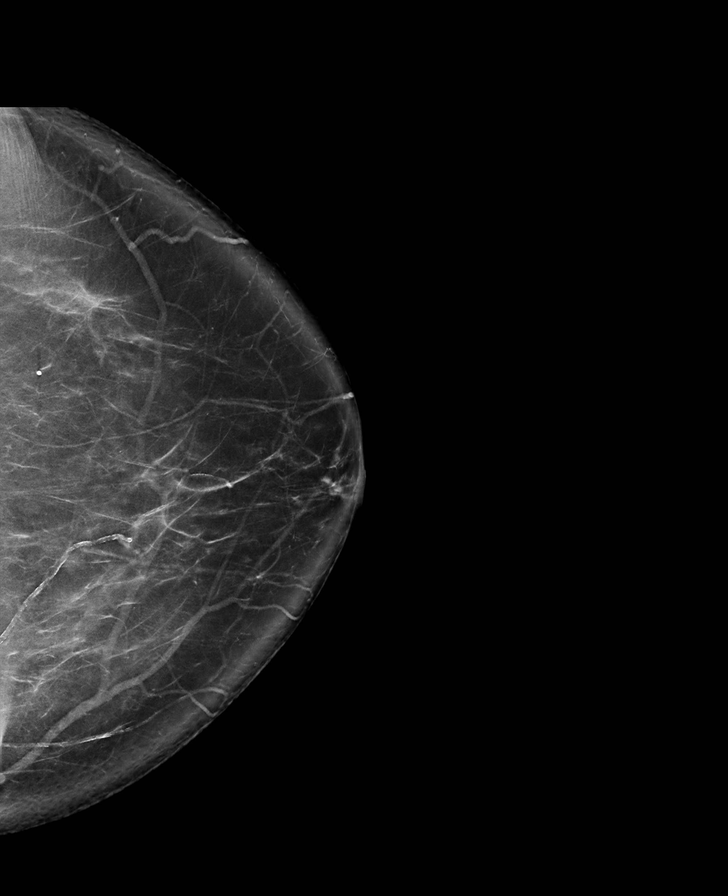

[R CC synth-2D]
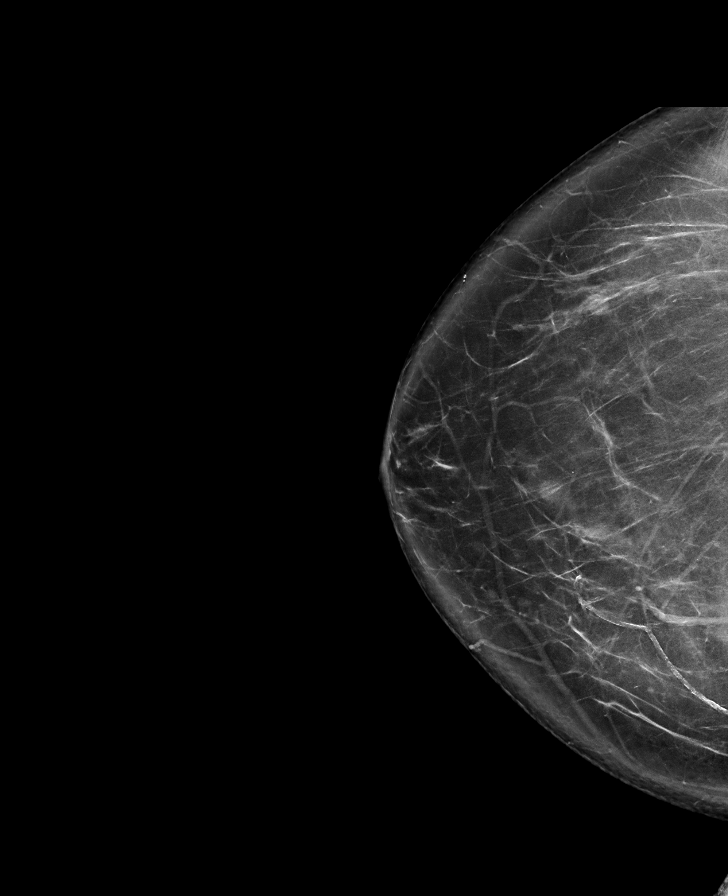

[R MLO synth-2D]
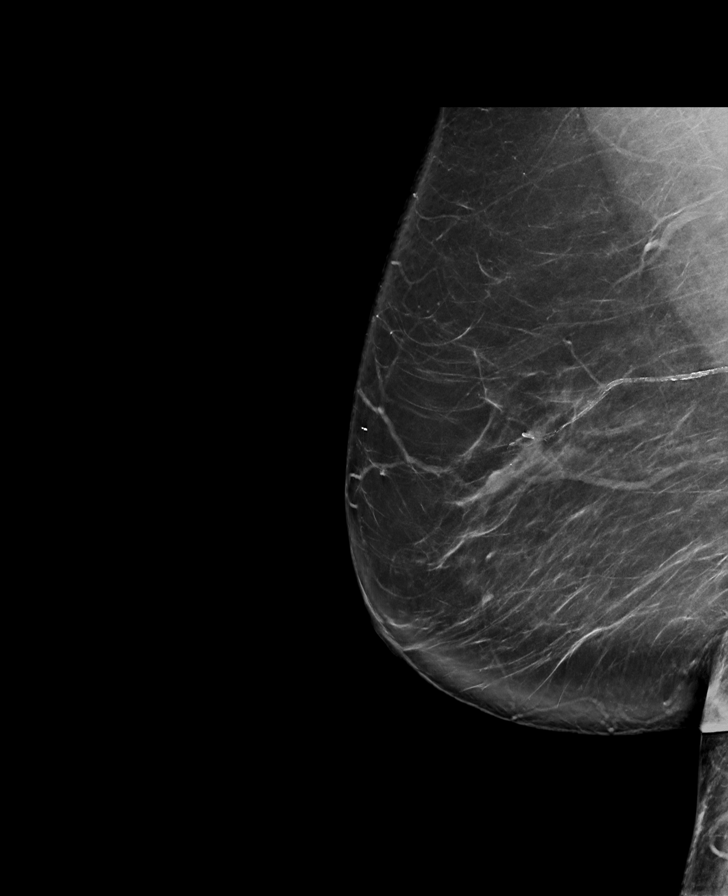

[L MLO synth-2D]
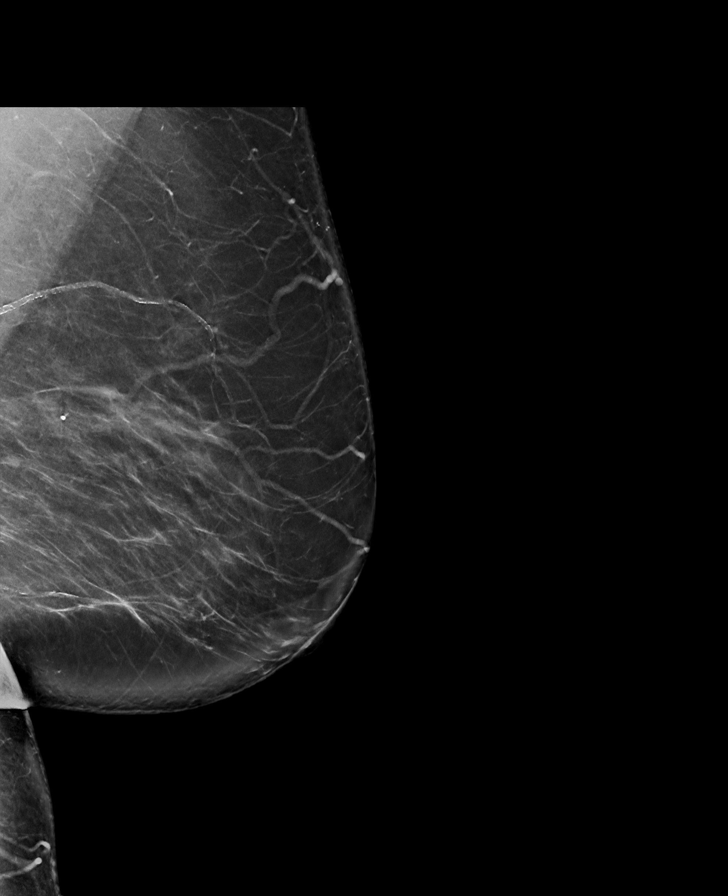

[R CC tomo · tomo slice 45/90.0]
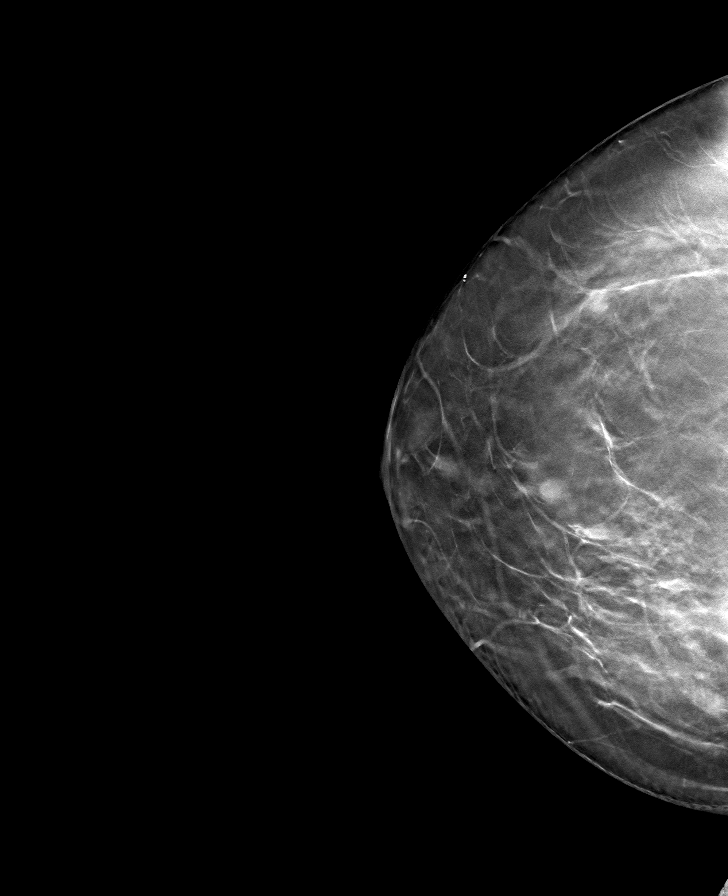

[L CC tomo · tomo slice 47/92.0]
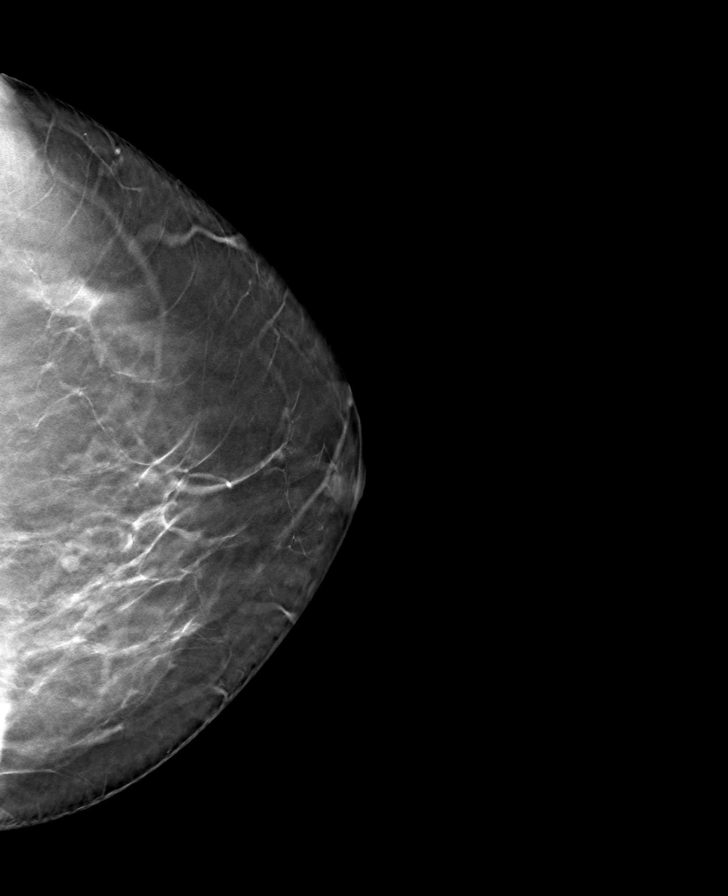

[L MLO tomo · tomo slice 51/100.0]
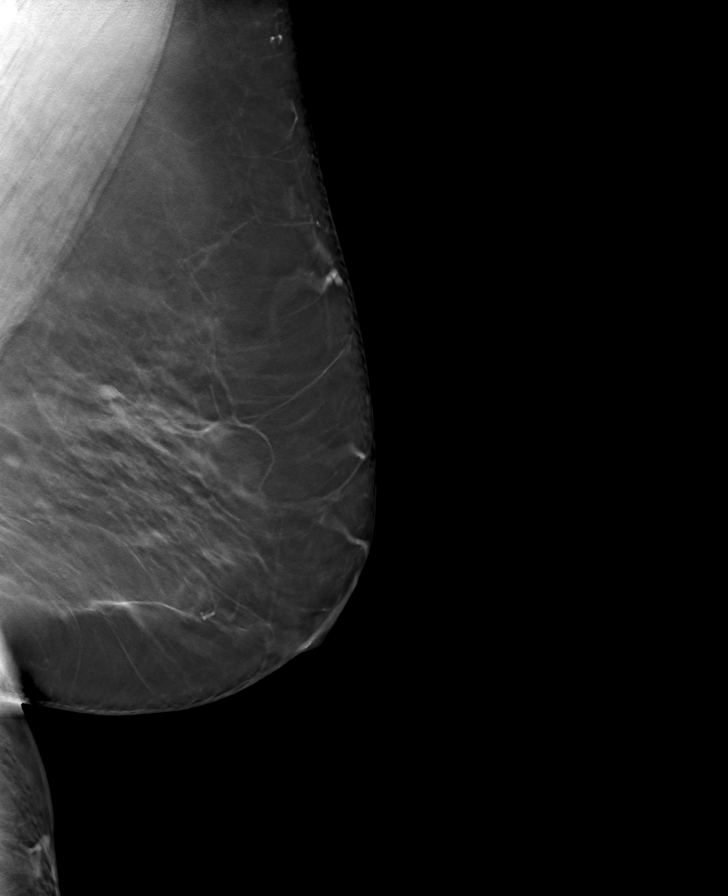

[R MLO tomo · tomo slice 49/97.0]
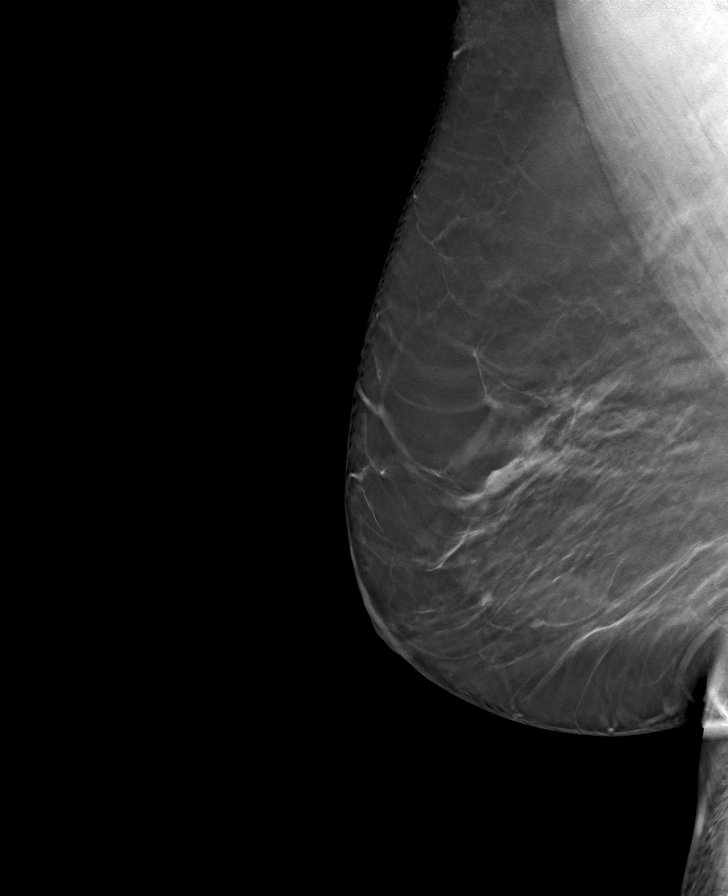

[8 of 24 positions shown; findings below may reference images not displayed]

ACR Breast Density Category b: There are scattered areas of
fibroglandular density.
FINDINGS: There are no findings suspicious for malignancy.
IMPRESSION: No mammographic evidence of malignancy. A result letter of this
screening mammogram will be mailed directly to the patient.

RECOMMENDATION:
Screening mammogram in one year. (Code:51-O-LD2)

BI-RADS CATEGORY  1: Negative.

## 2024-03-29 ENCOUNTER — Other Ambulatory Visit: Payer: Self-pay | Admitting: Nurse Practitioner

## 2024-03-29 DIAGNOSIS — Z1231 Encounter for screening mammogram for malignant neoplasm of breast: Secondary | ICD-10-CM

## 2024-05-03 ENCOUNTER — Ambulatory Visit
Admission: RE | Admit: 2024-05-03 | Discharge: 2024-05-03 | Disposition: A | Discharge status: Home / Self Care | Source: Ambulatory Visit | Attending: Nurse Practitioner | Admitting: Nurse Practitioner

## 2024-05-03 DIAGNOSIS — Z1231 Encounter for screening mammogram for malignant neoplasm of breast: Secondary | ICD-10-CM
